# Patient Record
Sex: Female | Born: 1990 | Race: White | Hispanic: Yes | Marital: Single | Smoking: Former smoker
Health system: Southern US, Community
[De-identification: ages and names within clinical notes are randomized; demographics above are authoritative.]

## PROBLEM LIST (undated history)

## (undated) ENCOUNTER — Emergency Department (HOSPITAL_COMMUNITY): Admission: EM | Source: Home / Self Care

## (undated) DIAGNOSIS — O200 Threatened abortion: Principal | ICD-10-CM

## (undated) DIAGNOSIS — Z3490 Encounter for supervision of normal pregnancy, unspecified, unspecified trimester: Secondary | ICD-10-CM

## (undated) DIAGNOSIS — Z789 Other specified health status: Secondary | ICD-10-CM

## (undated) DIAGNOSIS — R110 Nausea: Secondary | ICD-10-CM

## (undated) HISTORY — DX: Nausea: R11.0

## (undated) HISTORY — DX: Encounter for supervision of normal pregnancy, unspecified, unspecified trimester: Z34.90

## (undated) HISTORY — DX: Threatened abortion: O20.0

## (undated) HISTORY — PX: NO PAST SURGERIES: SHX2092

---
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
## 2011-03-07 LAB — RPR: RPR Ser Ql: NONREACTIVE

## 2011-03-07 LAB — CBC
HCT: 32.1 % — ABNORMAL LOW (ref 36.0–46.0)
MCH: 23.9 pg — ABNORMAL LOW (ref 26.0–34.0)
MCHC: 31.6 g/dL (ref 30.0–36.0)
RBC: 4.24 MIL/uL (ref 3.87–5.11)
WBC: 13.9 10*3/uL — ABNORMAL HIGH (ref 4.0–10.5)

## 2011-03-12 LAB — WET PREP, GENITAL
Clue Cells Wet Prep HPF POC: NONE SEEN
Trich, Wet Prep: NONE SEEN

## 2011-03-12 LAB — DIFFERENTIAL
Eosinophils Absolute: 0 10*3/uL (ref 0.0–1.2)
Eosinophils Relative: 0 % (ref 0–5)
Lymphocytes Relative: 10 % — ABNORMAL LOW (ref 24–48)
Monocytes Relative: 2 % — ABNORMAL LOW (ref 3–11)

## 2011-03-12 LAB — URINE MICROSCOPIC-ADD ON

## 2011-03-12 LAB — URINALYSIS, ROUTINE W REFLEX MICROSCOPIC
Ketones, ur: 40 mg/dL — AB
Nitrite: POSITIVE — AB
Protein, ur: 100 mg/dL — AB
Urobilinogen, UA: 0.2 mg/dL (ref 0.0–1.0)

## 2011-03-12 LAB — URINE CULTURE: Colony Count: >=100000

## 2011-03-12 LAB — CBC
HCT: 32.5 % — ABNORMAL LOW (ref 36.0–49.0)
MCH: 30.2 pg (ref 25.0–34.0)

## 2011-03-12 LAB — GC/CHLAMYDIA PROBE AMP, GENITAL: Chlamydia, DNA Probe: NEGATIVE

## 2011-03-13 LAB — WET PREP, GENITAL: Trich, Wet Prep: NONE SEEN

## 2011-03-13 LAB — DIFFERENTIAL
Basophils Absolute: 0 10*3/uL (ref 0.0–0.1)
Eosinophils Absolute: 0.2 10*3/uL (ref 0.0–1.2)
Eosinophils Relative: 2 % (ref 0–5)
Monocytes Absolute: 0.7 10*3/uL (ref 0.2–1.2)
Neutro Abs: 5 10*3/uL (ref 1.7–8.0)
Neutrophils Relative %: 57 % (ref 43–71)

## 2011-03-13 LAB — URINALYSIS, ROUTINE W REFLEX MICROSCOPIC
Bilirubin Urine: NEGATIVE
Hgb urine dipstick: NEGATIVE
Nitrite: NEGATIVE
Protein, ur: NEGATIVE mg/dL
Specific Gravity, Urine: <1.005 — ABNORMAL LOW (ref 1.005–1.030)
Urobilinogen, UA: 0.2 mg/dL (ref 0.0–1.0)
pH: 7 (ref 5.0–8.0)

## 2011-03-13 LAB — URINE MICROSCOPIC-ADD ON

## 2011-03-13 LAB — ABO/RH: ABO/RH(D): B POS

## 2011-03-13 LAB — CBC
MCHC: 33.3 g/dL (ref 31.0–37.0)
Platelets: 140 10*3/uL — ABNORMAL LOW (ref 150–400)
WBC: 8.8 10*3/uL (ref 4.5–13.5)

## 2011-03-13 LAB — RPR: RPR Ser Ql: NONREACTIVE

## 2011-03-13 LAB — GC/CHLAMYDIA PROBE AMP, GENITAL: Chlamydia, DNA Probe: NEGATIVE

## 2012-08-05 ENCOUNTER — Emergency Department: Payer: Self-pay | Admitting: Emergency Medicine

## 2013-03-14 ENCOUNTER — Encounter: Payer: Self-pay | Admitting: Adult Health

## 2013-03-18 ENCOUNTER — Other Ambulatory Visit (INDEPENDENT_AMBULATORY_CARE_PROVIDER_SITE_OTHER): Payer: Medicaid Other

## 2013-03-18 ENCOUNTER — Other Ambulatory Visit: Payer: Self-pay | Admitting: Obstetrics & Gynecology

## 2013-03-18 ENCOUNTER — Encounter: Payer: Self-pay | Admitting: Adult Health

## 2013-03-18 DIAGNOSIS — O3680X0 Pregnancy with inconclusive fetal viability, not applicable or unspecified: Secondary | ICD-10-CM

## 2013-03-18 LAB — US OB TRANSVAGINAL

## 2013-03-27 ENCOUNTER — Encounter: Payer: Self-pay | Admitting: *Deleted

## 2013-03-28 ENCOUNTER — Ambulatory Visit (INDEPENDENT_AMBULATORY_CARE_PROVIDER_SITE_OTHER): Payer: Medicaid Other | Admitting: Adult Health

## 2013-03-28 ENCOUNTER — Encounter: Payer: Self-pay | Admitting: Adult Health

## 2013-03-28 ENCOUNTER — Other Ambulatory Visit: Payer: Self-pay | Admitting: Obstetrics & Gynecology

## 2013-03-28 ENCOUNTER — Ambulatory Visit (INDEPENDENT_AMBULATORY_CARE_PROVIDER_SITE_OTHER): Payer: Medicaid Other

## 2013-03-28 VITALS — BP 120/76 | Ht 67.0 in | Wt 110.0 lb

## 2013-03-28 DIAGNOSIS — O3680X0 Pregnancy with inconclusive fetal viability, not applicable or unspecified: Secondary | ICD-10-CM

## 2013-03-28 DIAGNOSIS — O021 Missed abortion: Secondary | ICD-10-CM

## 2013-03-28 DIAGNOSIS — O200 Threatened abortion: Secondary | ICD-10-CM

## 2013-03-28 HISTORY — DX: Threatened abortion: O20.0

## 2013-03-28 LAB — TSH: TSH: 0.862 u[IU]/mL (ref 0.350–4.500)

## 2013-03-28 LAB — HEPATITIS B SURFACE ANTIGEN: Hepatitis B Surface Ag: NEGATIVE

## 2013-03-28 LAB — HIV ANTIBODY (ROUTINE TESTING W REFLEX): HIV: NONREACTIVE

## 2013-03-28 MED ORDER — PRENATAL VITAMINS PLUS 27-1 MG PO TABS: 1.0000 | ORAL_TABLET | Freq: Every day | ORAL | Status: DC

## 2013-03-28 NOTE — Patient Instructions (Addendum)
Follow up in 2 weeks with ultra sound Call with any bleeding, sign up for my chart

## 2013-03-28 NOTE — Progress Notes (Signed)
Patricia Vaughn is a 22 yo Hispanic female,G3 P1 A1 in for ultrasound.She has 2 sacs with 1 empty and 1 with a CRL showing no growth since 03/18/13. No bleeding or discharge, or nausea or vomiting.Discussed with Dr. Despina Hidden will get follow up ultrasound in 2 weeks and see him with that visit. Will get prenatal labs today and called [facility] in to [facility] [place].Discussed with [name] and her partner about the empty sac and no fetal growth on the ultrasound and possibility of this ending in miscarriage.She is to call with any pain or bleeding and can follow up labs next week.

## 2013-03-29 LAB — CBC
Hemoglobin: 11.1 g/dL — ABNORMAL LOW (ref 12.0–15.0)
MCHC: 31.7 g/dL (ref 30.0–36.0)
Platelets: 125 10*3/uL — ABNORMAL LOW (ref 150–400)
RBC: 4.44 MIL/uL (ref 3.87–5.11)

## 2013-03-29 LAB — ANTIBODY SCREEN: Antibody Screen: NEGATIVE

## 2013-03-29 LAB — SICKLE CELL SCREEN: Sickle Cell Screen: NEGATIVE

## 2013-03-31 ENCOUNTER — Other Ambulatory Visit: Payer: Self-pay | Admitting: Obstetrics & Gynecology

## 2013-03-31 DIAGNOSIS — O021 Missed abortion: Secondary | ICD-10-CM

## 2013-03-31 LAB — US OB TRANSVAGINAL

## 2013-03-31 LAB — VARICELLA ZOSTER ANTIBODY, IGG: Varicella IgG: 1663 Index — ABNORMAL HIGH (ref <135.00)

## 2013-04-01 ENCOUNTER — Telehealth: Payer: Self-pay | Admitting: Adult Health

## 2013-04-01 NOTE — Telephone Encounter (Signed)
Called pt. With labs, number disconnected

## 2013-04-07 ENCOUNTER — Encounter: Payer: Self-pay | Admitting: *Deleted

## 2013-04-07 ENCOUNTER — Ambulatory Visit: Payer: Medicaid Other | Admitting: Women's Health

## 2013-04-15 ENCOUNTER — Encounter: Payer: Medicaid Other | Admitting: Obstetrics & Gynecology

## 2013-04-15 ENCOUNTER — Other Ambulatory Visit: Payer: Medicaid Other

## 2013-09-24 ENCOUNTER — Other Ambulatory Visit: Payer: Self-pay | Admitting: Obstetrics & Gynecology

## 2013-09-24 DIAGNOSIS — O3680X0 Pregnancy with inconclusive fetal viability, not applicable or unspecified: Secondary | ICD-10-CM

## 2013-09-30 ENCOUNTER — Ambulatory Visit (INDEPENDENT_AMBULATORY_CARE_PROVIDER_SITE_OTHER): Payer: Medicaid Other

## 2013-09-30 ENCOUNTER — Other Ambulatory Visit: Payer: Medicaid Other

## 2013-09-30 DIAGNOSIS — O3680X0 Pregnancy with inconclusive fetal viability, not applicable or unspecified: Secondary | ICD-10-CM

## 2013-09-30 DIAGNOSIS — O09299 Supervision of pregnancy with other poor reproductive or obstetric history, unspecified trimester: Secondary | ICD-10-CM

## 2013-09-30 NOTE — Progress Notes (Signed)
U/S-single IUP with +FCA noted, FHR-165 bpm, cx long and closed (4.2cm), bilateral adnexa wnl, CRL c/w 12+4wks, EDD 04/10/2014, pt to think about if she desires NT/IT screening

## 2013-10-01 ENCOUNTER — Other Ambulatory Visit: Payer: Self-pay | Admitting: Obstetrics & Gynecology

## 2013-10-01 DIAGNOSIS — O09299 Supervision of pregnancy with other poor reproductive or obstetric history, unspecified trimester: Secondary | ICD-10-CM

## 2013-10-01 DIAGNOSIS — Z36 Encounter for antenatal screening of mother: Secondary | ICD-10-CM

## 2013-10-02 ENCOUNTER — Other Ambulatory Visit: Payer: Medicaid Other

## 2013-10-02 ENCOUNTER — Encounter: Payer: Self-pay | Admitting: *Deleted

## 2013-10-08 ENCOUNTER — Encounter: Payer: Self-pay | Admitting: *Deleted

## 2013-10-08 ENCOUNTER — Encounter: Payer: Medicaid Other | Admitting: Adult Health

## 2013-11-06 ENCOUNTER — Encounter (INDEPENDENT_AMBULATORY_CARE_PROVIDER_SITE_OTHER): Payer: Self-pay

## 2013-11-06 ENCOUNTER — Ambulatory Visit (INDEPENDENT_AMBULATORY_CARE_PROVIDER_SITE_OTHER): Payer: Medicaid Other | Admitting: Adult Health

## 2013-11-06 ENCOUNTER — Encounter: Payer: Self-pay | Admitting: Adult Health

## 2013-11-06 ENCOUNTER — Other Ambulatory Visit: Payer: Self-pay | Admitting: Adult Health

## 2013-11-06 ENCOUNTER — Other Ambulatory Visit (HOSPITAL_COMMUNITY)
Admission: RE | Admit: 2013-11-06 | Discharge: 2013-11-06 | Disposition: A | Discharge status: Home / Self Care | Payer: Medicaid Other | Source: Ambulatory Visit | Attending: Adult Health | Admitting: Adult Health

## 2013-11-06 VITALS — BP 110/60 | Wt 121.0 lb

## 2013-11-06 DIAGNOSIS — Z113 Encounter for screening for infections with a predominantly sexual mode of transmission: Secondary | ICD-10-CM | POA: Insufficient documentation

## 2013-11-06 DIAGNOSIS — Z1389 Encounter for screening for other disorder: Secondary | ICD-10-CM

## 2013-11-06 DIAGNOSIS — Z3482 Encounter for supervision of other normal pregnancy, second trimester: Secondary | ICD-10-CM

## 2013-11-06 DIAGNOSIS — Z3492 Encounter for supervision of normal pregnancy, unspecified, second trimester: Secondary | ICD-10-CM

## 2013-11-06 DIAGNOSIS — Z3480 Encounter for supervision of other normal pregnancy, unspecified trimester: Secondary | ICD-10-CM | POA: Insufficient documentation

## 2013-11-06 DIAGNOSIS — Z331 Pregnant state, incidental: Secondary | ICD-10-CM

## 2013-11-06 DIAGNOSIS — R110 Nausea: Secondary | ICD-10-CM

## 2013-11-06 DIAGNOSIS — Z3490 Encounter for supervision of normal pregnancy, unspecified, unspecified trimester: Secondary | ICD-10-CM

## 2013-11-06 DIAGNOSIS — O99019 Anemia complicating pregnancy, unspecified trimester: Secondary | ICD-10-CM

## 2013-11-06 DIAGNOSIS — O09299 Supervision of pregnancy with other poor reproductive or obstetric history, unspecified trimester: Secondary | ICD-10-CM

## 2013-11-06 DIAGNOSIS — Z01419 Encounter for gynecological examination (general) (routine) without abnormal findings: Secondary | ICD-10-CM | POA: Insufficient documentation

## 2013-11-06 HISTORY — DX: Nausea: R11.0

## 2013-11-06 HISTORY — DX: Encounter for supervision of normal pregnancy, unspecified, unspecified trimester: Z34.90

## 2013-11-06 LAB — POCT URINALYSIS DIPSTICK
Blood, UA: NEGATIVE
Glucose, UA: NEGATIVE
Ketones, UA: NEGATIVE
Protein, UA: NEGATIVE

## 2013-11-06 MED ORDER — PROMETHAZINE HCL 25 MG PO TABS: 25.0000 mg | ORAL_TABLET | Freq: Four times a day (QID) | ORAL | Status: DC | PRN

## 2013-11-06 MED ORDER — PRENATAL VITAMINS PLUS 27-1 MG PO TABS: 1.0000 | ORAL_TABLET | Freq: Every day | ORAL | Status: DC

## 2013-11-06 NOTE — Patient Instructions (Signed)
Second Trimester of Pregnancy The second trimester is from week 13 through week 28, months 4 through 6. The second trimester is often a time when you feel your best. Your body has also adjusted to being pregnant, and you begin to feel better physically. Usually, morning sickness has lessened or quit completely, you may have more energy, and you may have an increase in appetite. The second trimester is also a time when the fetus is growing rapidly. At the end of the sixth month, the fetus is about 9 inches long and weighs about 1 pounds. You will likely begin to feel the baby move (quickening) between 18 and 20 weeks of the pregnancy. BODY CHANGES Your body goes through many changes during pregnancy. The changes vary from woman to woman.   Your weight will continue to increase. You will notice your lower abdomen bulging out.  You may begin to get stretch marks on your hips, abdomen, and breasts.  You may develop headaches that can be relieved by medicines approved by your caregiver.  You may urinate more often because the fetus is pressing on your bladder.  You may develop or continue to have heartburn as a result of your pregnancy.  You may develop constipation because certain hormones are causing the muscles that push waste through your intestines to slow down.  You may develop hemorrhoids or swollen, bulging veins (varicose veins).  You may have back pain because of the weight gain and pregnancy hormones relaxing your joints between the bones in your pelvis and as a result of a shift in weight and the muscles that support your balance.  Your breasts will continue to grow and be tender.  Your gums may bleed and may be sensitive to brushing and flossing.  Dark spots or blotches (chloasma, mask of pregnancy) may develop on your face. This will likely fade after the baby is born.  A dark line from your belly button to the pubic area (linea nigra) may appear. This will likely fade after the  baby is born. WHAT TO EXPECT AT YOUR PRENATAL VISITS During a routine prenatal visit:  You will be weighed to make sure you and the fetus are growing normally.  Your blood pressure will be taken.  Your abdomen will be measured to track your baby's growth.  The fetal heartbeat will be listened to.  Any test results from the previous visit will be discussed. Your caregiver may ask you:  How you are feeling.  If you are feeling the baby move.  If you have had any abnormal symptoms, such as leaking fluid, bleeding, severe headaches, or abdominal cramping.  If you have any questions. Other tests that may be performed during your second trimester include:  Blood tests that check for:  Low iron levels (anemia).  Gestational diabetes (between 24 and 28 weeks).  Rh antibodies.  Urine tests to check for infections, diabetes, or protein in the urine.  An ultrasound to confirm the proper growth and development of the baby.  An amniocentesis to check for possible genetic problems.  Fetal screens for spina bifida and Down syndrome. HOME CARE INSTRUCTIONS   Avoid all smoking, herbs, alcohol, and unprescribed drugs. These chemicals affect the formation and growth of the baby.  Follow your caregiver's instructions regarding medicine use. There are medicines that are either safe or unsafe to take during pregnancy.  Exercise only as directed by your caregiver. Experiencing uterine cramps is a good sign to stop exercising.  Continue to eat regular,   healthy meals.  Wear a good support bra for breast tenderness.  Do not use hot tubs, steam rooms, or saunas.  Wear your seat belt at all times when driving.  Avoid raw meat, uncooked cheese, cat litter boxes, and soil used by cats. These carry germs that can cause birth defects in the baby.  Take your prenatal vitamins.  Try taking a stool softener (if your caregiver approves) if you develop constipation. Eat more high-fiber foods,  such as fresh vegetables or fruit and whole grains. Drink plenty of fluids to keep your urine clear or pale yellow.  Take warm sitz baths to soothe any pain or discomfort caused by hemorrhoids. Use hemorrhoid cream if your caregiver approves.  If you develop varicose veins, wear support hose. Elevate your feet for 15 minutes, 3 4 times a day. Limit salt in your diet.  Avoid heavy lifting, wear low heel shoes, and practice good posture.  Rest with your legs elevated if you have leg cramps or low back pain.  Visit your dentist if you have not gone yet during your pregnancy. Use a soft toothbrush to brush your teeth and be gentle when you floss.  A sexual relationship may be continued unless your caregiver directs you otherwise.  Continue to go to all your prenatal visits as directed by your caregiver. SEEK MEDICAL CARE IF:   You have dizziness.  You have mild pelvic cramps, pelvic pressure, or nagging pain in the abdominal area.  You have persistent nausea, vomiting, or diarrhea.  You have a bad smelling vaginal discharge.  You have pain with urination. SEEK IMMEDIATE MEDICAL CARE IF:   You have a fever.  You are leaking fluid from your vagina.  You have spotting or bleeding from your vagina.  You have severe abdominal cramping or pain.  You have rapid weight gain or loss.  You have shortness of breath with chest pain.  You notice sudden or extreme swelling of your face, hands, ankles, feet, or legs.  You have not felt your baby move in over an hour.  You have severe headaches that do not go away with medicine.  You have vision changes. Document Released: 12/05/2001 Document Revised: 08/13/2013 Document Reviewed: 02/11/2013 [employer] Patient Information 2014 [employer]. Return in 2 weeks for US  

## 2013-11-06 NOTE — Progress Notes (Signed)
  Subjective:    Patricia Vaughn is a 22 y.o. G72P1021 Hispanic female at [redacted]w[redacted]d by [place] being seen today for her first obstetrical visit.  Her obstetrical history is significant for late presentation, quit smoking, has some nausa..  Pregnancy history fully reviewed.   Patient reports nausea, will rx phenergan.  Filed Vitals:   11/06/13 1519  BP: 110/60  Weight: 121 lb (54.885 kg)    HISTORY: OB History  Gravida Para Term Preterm AB SAB TAB Ectopic Multiple Living  4 1 1  0 2 2 0 0 0 1    # Outcome Date GA Lbr Len/2nd Weight Sex Delivery Anes PTL Lv  4 CUR           3 SAB 2014             Comments: System Generated. Please review and update pregnancy details.  2 TRM 12/02/10 [redacted]w[redacted]d  7 lb 6 oz (3.345 kg) F SVD   Y  1 SAB 2011             Past Medical History  Diagnosis Date  . Miscarriage, threatened, early pregnancy 03/28/2013  . Supervision of low-risk pregnancy 11/06/2013  . Nausea 11/06/2013   History reviewed. No pertinent past surgical history. Family History  Problem Relation Age of Onset  . Diabetes Other   . Hypertension Other   . Hyperlipidemia Father      Exam    Pelvic Exam:    Perineum: Normal Perineum   Vulva: normal   Vagina:  normal mucosa, normal discharge, no palpable nodules   Uterus   18 weks     Cervix: normal   Adnexa: Not palpable   Urinary: urethral meatus normal    System:     Skin: normal coloration and turgor, no rashes    Neurologic: oriented, normal mood   Extremities: normal strength, tone, and muscle mass   HEENT PERRLA   Mouth/Teeth mucous membranes moist   Cardiovascular: regular rate and rhythm   Respiratory:  appears well, vitals normal, no respiratory distress, acyanotic, normal RR   Abdomen: soft, non-tender    Thin prep pap smear with GC/CHL performed.   Assessment:    Pregnancy: [id number] Patient Active Problem List   Diagnosis Date Noted  . Supervision of low-risk pregnancy 11/06/2013  . Nausea 11/06/2013  .  Miscarriage, threatened, early pregnancy 03/28/2013      [redacted]w[redacted]d G4P1021 New OB visit    Plan:     Initial labs drawn Continue prenatal vitamins Problem list reviewed and updated Reviewed n/v relief measures and warning s/s to report Reviewed recommended weight gain based on pre-gravid BMI Encouraged well-balanced diet Genetic Screening discussed Quad Screen: requested Cystic fibrosis screening discussed requested Ultrasound discussed; fetal survey: requested Follow up in 2 weeks for [place] and see Dr Despina Hidden  GRIFFIN,JENNIFER 11/06/2013 3:55 PM

## 2013-11-06 NOTE — Progress Notes (Signed)
Pt here for New OB visit. Pt states that she is having some pain in her lower back. Pt given CCNC form and lab consent to read over and sign.

## 2013-11-07 LAB — URINALYSIS
Bilirubin Urine: NEGATIVE
Hgb urine dipstick: NEGATIVE
Ketones, ur: NEGATIVE mg/dL
Nitrite: NEGATIVE
Protein, ur: NEGATIVE mg/dL
Specific Gravity, Urine: 1.014 (ref 1.005–1.030)
Urobilinogen, UA: 1 mg/dL (ref 0.0–1.0)

## 2013-11-07 LAB — DRUG SCREEN, URINE, NO CONFIRMATION
Amphetamine Screen, Ur: NEGATIVE
Barbiturate Quant, Ur: NEGATIVE
Benzodiazepines.: NEGATIVE
Cocaine Metabolites: NEGATIVE
Marijuana Metabolite: NEGATIVE
Opiate Screen, Urine: NEGATIVE
Phencyclidine (PCP): NEGATIVE

## 2013-11-07 LAB — AFP, QUAD SCREEN
AFP: 42 IU/mL
Age Alone: 1:1160 {titer}
Down Syndrome Scr Risk Est: 1:7510 {titer}
HCG, Total: 11857 m[IU]/mL
Interpretation-AFP: NEGATIVE
MoM for AFP: 0.97
MoM for INH: 1.34
MoM for hCG: 0.67
Open Spina bifida: NEGATIVE
Tri 18 Scr Risk Est: NEGATIVE
uE3 Mom: 0.79
uE3 Value: 0.9 ng/mL

## 2013-11-07 LAB — CBC
MCH: 28.4 pg (ref 26.0–34.0)
MCV: 84.1 fL (ref 78.0–100.0)
Platelets: 149 10*3/uL — ABNORMAL LOW (ref 150–400)
RBC: 3.95 MIL/uL (ref 3.87–5.11)
RDW: 15.4 % (ref 11.5–15.5)
WBC: 7.9 10*3/uL (ref 4.0–10.5)

## 2013-11-07 LAB — RUBELLA SCREEN: Rubella: 1.33 Index — ABNORMAL HIGH (ref <0.90)

## 2013-11-07 LAB — VARICELLA ZOSTER ANTIBODY, IGG: Varicella IgG: 932.9 Index — ABNORMAL HIGH (ref <135.00)

## 2013-11-07 LAB — SICKLE CELL SCREEN: Sickle Cell Screen: NEGATIVE

## 2013-11-10 LAB — CYSTIC FIBROSIS DIAGNOSTIC STUDY

## 2013-11-19 ENCOUNTER — Ambulatory Visit (INDEPENDENT_AMBULATORY_CARE_PROVIDER_SITE_OTHER): Payer: Medicaid Other

## 2013-11-19 ENCOUNTER — Encounter: Payer: Self-pay | Admitting: Obstetrics & Gynecology

## 2013-11-19 ENCOUNTER — Other Ambulatory Visit: Payer: Self-pay | Admitting: Adult Health

## 2013-11-19 ENCOUNTER — Encounter (INDEPENDENT_AMBULATORY_CARE_PROVIDER_SITE_OTHER): Payer: Self-pay

## 2013-11-19 ENCOUNTER — Ambulatory Visit (INDEPENDENT_AMBULATORY_CARE_PROVIDER_SITE_OTHER): Payer: Medicaid Other | Admitting: Obstetrics & Gynecology

## 2013-11-19 VITALS — BP 100/60 | Wt 121.0 lb

## 2013-11-19 DIAGNOSIS — O09299 Supervision of pregnancy with other poor reproductive or obstetric history, unspecified trimester: Secondary | ICD-10-CM

## 2013-11-19 DIAGNOSIS — Z1389 Encounter for screening for other disorder: Secondary | ICD-10-CM

## 2013-11-19 DIAGNOSIS — F1920 Other psychoactive substance dependence, uncomplicated: Secondary | ICD-10-CM

## 2013-11-19 DIAGNOSIS — O99019 Anemia complicating pregnancy, unspecified trimester: Secondary | ICD-10-CM

## 2013-11-19 DIAGNOSIS — Z3482 Encounter for supervision of other normal pregnancy, second trimester: Secondary | ICD-10-CM

## 2013-11-19 DIAGNOSIS — Z331 Pregnant state, incidental: Secondary | ICD-10-CM

## 2013-11-19 LAB — POCT URINALYSIS DIPSTICK
Blood, UA: NEGATIVE
Ketones, UA: NEGATIVE
Leukocytes, UA: NEGATIVE

## 2013-11-19 NOTE — Progress Notes (Signed)
BP weight and urine results all reviewed and noted. Patient reports good fetal movement, denies any bleeding and no rupture of membranes symptoms or regular contractions. Patient is without complaints. All questions were answered.  

## 2013-11-19 NOTE — Progress Notes (Signed)
U/S(19+5wks)-active fetus, meas c/w dates, fluid wnl, anterior gr 0 placenta, cx long and closed 3.3cm, bilateral adnexa wnl, no major abnl noted, female fetus, FHR 142 bpm,

## 2013-11-19 NOTE — Addendum Note (Signed)
Addended by: Richardson Chiquito on: 11/19/2013 12:27 PM   Modules accepted: Orders

## 2013-12-16 ENCOUNTER — Ambulatory Visit (INDEPENDENT_AMBULATORY_CARE_PROVIDER_SITE_OTHER): Payer: Medicaid Other | Admitting: Advanced Practice Midwife

## 2013-12-16 ENCOUNTER — Encounter: Payer: Self-pay | Admitting: Advanced Practice Midwife

## 2013-12-16 VITALS — BP 94/54 | Wt 128.5 lb

## 2013-12-16 DIAGNOSIS — O09299 Supervision of pregnancy with other poor reproductive or obstetric history, unspecified trimester: Secondary | ICD-10-CM

## 2013-12-16 DIAGNOSIS — Z3492 Encounter for supervision of normal pregnancy, unspecified, second trimester: Secondary | ICD-10-CM

## 2013-12-16 DIAGNOSIS — O99019 Anemia complicating pregnancy, unspecified trimester: Secondary | ICD-10-CM

## 2013-12-16 DIAGNOSIS — Z23 Encounter for immunization: Secondary | ICD-10-CM

## 2013-12-16 DIAGNOSIS — Z1389 Encounter for screening for other disorder: Secondary | ICD-10-CM

## 2013-12-16 DIAGNOSIS — Z331 Pregnant state, incidental: Secondary | ICD-10-CM

## 2013-12-16 LAB — POCT URINALYSIS DIPSTICK
Glucose, UA: NEGATIVE
Ketones, UA: NEGATIVE
Leukocytes, UA: NEGATIVE
Nitrite, UA: NEGATIVE

## 2013-12-16 NOTE — Progress Notes (Signed)
No c/o at this time except round igament pain., Handout given.  Maternity belt recommeded  Routine questions about pregnancy answered.  F/U in 4 weeks for PN2/LROB.Marland Kitchen

## 2013-12-16 NOTE — Patient Instructions (Addendum)
1. Before your test, do not eat or drink anything for 8-10 hours prior to your  appointment (a small amount of water is allowed and you may take any medicines you normally take). 2. When you arrive, your blood will be drawn for a 'fasting' blood sugar level.  Then you will be given a sweetened carbonated beverage to drink. You should  complete drinking this beverage within five minutes. After finishing the  beverage, you will have your blood drawn exactly 1 and 2 hours later. Having  your blood drawn on time is an important part of this test. A total of three blood  samples will be done. 3. The test takes approximately 2  hours. During the test, do not have anything to  eat or drink. Do not smoke, chew gum (not even sugarless gum) or use breath mints.  4. During the test you should remain close by and seated as much as possible and  avoid walking around. You may want to bring a book or something else to  occupy your time.  5. After your test, you may eat and drink as normal. You may want to bring a snack  to eat after the test is finished. Your provider will advise you as to the results of  this test and any follow-up if necessary Abdominal Pain During Pregnancy Abdominal discomfort is common in pregnancy. Most of the time, it does not cause harm. There are many causes of abdominal pain. Some causes are more serious than others. Some of the causes of abdominal pain in pregnancy are easily diagnosed. Occasionally, the diagnosis takes time to understand. Other times, the cause is not determined. Abdominal pain can be a sign that something is very wrong with the pregnancy, or the pain may have nothing to do with the pregnancy at all. For this reason, always tell your caregiver if you have any abdominal discomfort. CAUSES Common and harmless causes of abdominal pain include:  Constipation.  Excess gas and bloating.  Round ligament pain. This is pain that is felt in the folds of the  groin.  The position the baby or placenta is in.  Baby kicks.  Braxton-Hicks contractions. These are mild contractions that do not cause cervical dilation. Serious causes of abdominal pain include:  Ectopic pregnancy. This happens when a fertilized egg implants outside of the uterus.  Miscarriage.  Preterm labor. This is when labor starts at less than 37 weeks of pregnancy.  Placental abruption. This is when the placenta partially or completely separates from the uterus.  Preeclampsia. This is often associated with high blood pressure and has been referred to as "toxemia in pregnancy."  Uterine or amniotic fluid infections. Causes unrelated to pregnancy include:  Urinary tract infection.  Gallbladder stones or inflammation.  Hepatitis or other liver illness.  Intestinal problems, stomach flu, food poisoning, or ulcer.  Appendicitis.  Kidney (renal) stones.  Kidney infection (pylonephritis). HOME CARE INSTRUCTIONS  For mild pain:  Do not have sexual intercourse or put anything in your vagina until your symptoms go away completely.  Get plenty of rest until your pain improves. If your pain does not improve in 1 hour, call your caregiver.  Drink clear fluids if you feel nauseous. Avoid solid food as long as you are uncomfortable or nauseous.  Only take medicine as directed by your caregiver.  Keep all follow-up appointments with your caregiver. SEEK IMMEDIATE MEDICAL CARE IF:  You are bleeding, leaking fluid, or passing tissue from the vagina.  You  have increasing pain or cramping.  You have persistent vomiting.  You have painful or bloody urination.  You have a fever.  You notice a decrease in your baby's movements.  You have extreme weakness or feel faint.  You have shortness of breath, with or without abdominal pain.  You develop a severe headache with abdominal pain.  You have abnormal vaginal discharge with abdominal pain.  You have persistent  diarrhea.  You have abdominal pain that continues even after rest, or gets worse. MAKE SURE YOU:   Understand these instructions.  Will watch your condition.  Will get help right away if you are not doing well or get worse. Document Released: 12/11/2005 Document Revised: 03/04/2012 Document Reviewed: 07/10/2013 [facility] Patient Information 2014 [place], [place].

## 2013-12-25 NOTE — L&D Delivery Note (Signed)
Attestation of Attending Supervision of Advanced Practitioner (CNM/NP): Evaluation and management procedures were performed by the Advanced Practitioner under my supervision and collaboration. I have reviewed the Advanced Practitioner's note and chart, and I agree with the management and plan.  Lesly DukesKelly H Ho Parisi 5:45 PM

## 2013-12-25 NOTE — L&D Delivery Note (Signed)
Delivery Note At 6:53 PM a viable female was delivered via Vaginal, Spontaneous Delivery (Presentation: Right Occiput Anterior).  APGAR: 9, 9; weight:pending .   Placenta status: Intact, Spontaneous.  Cord: 3 vessels with the following complications: None.  Infant birthed without complications and was placed directly on mom's abdomen for bonding/skin-to-skin. Cord allowed to stop pulsing, and was clamped x 2, and cut by fob.   Anesthesia: None  Episiotomy: None Lacerations: none Suture Repair: n/a Est. Blood Loss (mL): 300  Placenta to: BS Feeding: breast/bottle Circ: n/a Contraception: depo   Mom to postpartum.  Baby to Couplet care / Skin to Skin.  Cheron EveryKimberly Randall [facility] 04/11/2014, 7:12 PM

## 2014-01-14 ENCOUNTER — Other Ambulatory Visit: Payer: Medicaid Other

## 2014-01-14 ENCOUNTER — Ambulatory Visit (INDEPENDENT_AMBULATORY_CARE_PROVIDER_SITE_OTHER): Payer: Medicaid Other | Admitting: Women's Health

## 2014-01-14 ENCOUNTER — Encounter: Payer: Self-pay | Admitting: Women's Health

## 2014-01-14 VITALS — BP 102/58 | Wt 131.5 lb

## 2014-01-14 DIAGNOSIS — Z1389 Encounter for screening for other disorder: Secondary | ICD-10-CM

## 2014-01-14 DIAGNOSIS — Z3480 Encounter for supervision of other normal pregnancy, unspecified trimester: Secondary | ICD-10-CM

## 2014-01-14 DIAGNOSIS — O09299 Supervision of pregnancy with other poor reproductive or obstetric history, unspecified trimester: Secondary | ICD-10-CM

## 2014-01-14 DIAGNOSIS — Z3490 Encounter for supervision of normal pregnancy, unspecified, unspecified trimester: Secondary | ICD-10-CM

## 2014-01-14 DIAGNOSIS — Z331 Pregnant state, incidental: Secondary | ICD-10-CM

## 2014-01-14 DIAGNOSIS — O99019 Anemia complicating pregnancy, unspecified trimester: Secondary | ICD-10-CM

## 2014-01-14 LAB — CBC
HEMATOCRIT: 34 % — AB (ref 36.0–46.0)
HEMOGLOBIN: 11.1 g/dL — AB (ref 12.0–15.0)
MCH: 27.3 pg (ref 26.0–34.0)
MCHC: 32.6 g/dL (ref 30.0–36.0)
MCV: 83.7 fL (ref 78.0–100.0)
Platelets: 148 10*3/uL — ABNORMAL LOW (ref 150–400)
RBC: 4.06 MIL/uL (ref 3.87–5.11)
RDW: 13.8 % (ref 11.5–15.5)
WBC: 9.1 10*3/uL (ref 4.0–10.5)

## 2014-01-14 LAB — POCT URINALYSIS DIPSTICK
Blood, UA: NEGATIVE
Glucose, UA: NEGATIVE
Ketones, UA: NEGATIVE
Leukocytes, UA: NEGATIVE
NITRITE UA: NEGATIVE
PROTEIN UA: NEGATIVE

## 2014-01-14 NOTE — Patient Instructions (Signed)
Third Trimester of Pregnancy The third trimester is from week 29 through week 42, months 7 through 9. The third trimester is a time when the fetus is growing rapidly. At the end of the ninth month, the fetus is about 20 inches in length and weighs 6 10 pounds.  BODY CHANGES Your body goes through many changes during pregnancy. The changes vary from woman to woman.   Your weight will continue to increase. You can expect to gain 25 35 pounds (11 16 kg) by the end of the pregnancy.  You may begin to get stretch marks on your hips, abdomen, and breasts.  You may urinate more often because the fetus is moving lower into your pelvis and pressing on your bladder.  You may develop or continue to have heartburn as a result of your pregnancy.  You may develop constipation because certain hormones are causing the muscles that push waste through your intestines to slow down.  You may develop hemorrhoids or swollen, bulging veins (varicose veins).  You may have pelvic pain because of the weight gain and pregnancy hormones relaxing your joints between the bones in your pelvis. Back aches may result from over exertion of the muscles supporting your posture.  Your breasts will continue to grow and be tender. A yellow discharge may leak from your breasts called colostrum.  Your belly button may stick out.  You may feel short of breath because of your expanding uterus.  You may notice the fetus "dropping," or moving lower in your abdomen.  You may have a bloody mucus discharge. This usually occurs a few days to a week before labor begins.  Your cervix becomes thin and soft (effaced) near your due date. WHAT TO EXPECT AT YOUR PRENATAL EXAMS  You will have prenatal exams every 2 weeks until week 36. Then, you will have weekly prenatal exams. During a routine prenatal visit:  You will be weighed to make sure you and the fetus are growing normally.  Your blood pressure is taken.  Your abdomen will be  measured to track your baby's growth.  The fetal heartbeat will be listened to.  Any test results from the previous visit will be discussed.  You may have a cervical check near your due date to see if you have effaced. At around 36 weeks, your caregiver will check your cervix. At the same time, your caregiver will also perform a test on the secretions of the vaginal tissue. This test is to determine if a type of bacteria, Group B streptococcus, is present. Your caregiver will explain this further. Your caregiver may ask you:  What your birth plan is.  How you are feeling.  If you are feeling the baby move.  If you have had any abnormal symptoms, such as leaking fluid, bleeding, severe headaches, or abdominal cramping.  If you have any questions. Other tests or screenings that may be performed during your third trimester include:  Blood tests that check for low iron levels (anemia).  Fetal testing to check the health, activity level, and growth of the fetus. Testing is done if you have certain medical conditions or if there are problems during the pregnancy. FALSE LABOR You may feel small, irregular contractions that eventually go away. These are called Braxton Hicks contractions, or false labor. Contractions may last for hours, days, or even weeks before true labor sets in. If contractions come at regular intervals, intensify, or become painful, it is best to be seen by your caregiver.  SIGNS OF LABOR   Menstrual-like cramps.  Contractions that are 5 minutes apart or less.  Contractions that start on the top of the uterus and spread down to the lower abdomen and back.  A sense of increased pelvic pressure or back pain.  A watery or bloody mucus discharge that comes from the vagina. If you have any of these signs before the 37th week of pregnancy, call your caregiver right away. You need to go to the hospital to get checked immediately. HOME CARE INSTRUCTIONS   Avoid all  smoking, herbs, alcohol, and unprescribed drugs. These chemicals affect the formation and growth of the baby.  Follow your caregiver's instructions regarding medicine use. There are medicines that are either safe or unsafe to take during pregnancy.  Exercise only as directed by your caregiver. Experiencing uterine cramps is a good sign to stop exercising.  Continue to eat regular, healthy meals.  Wear a good support bra for breast tenderness.  Do not use hot tubs, steam rooms, or saunas.  Wear your seat belt at all times when driving.  Avoid raw meat, uncooked cheese, cat litter boxes, and soil used by cats. These carry germs that can cause birth defects in the baby.  Take your prenatal vitamins.  Try taking a stool softener (if your caregiver approves) if you develop constipation. Eat more high-fiber foods, such as fresh vegetables or fruit and whole grains. Drink plenty of fluids to keep your urine clear or pale yellow.  Take warm sitz baths to soothe any pain or discomfort caused by hemorrhoids. Use hemorrhoid cream if your caregiver approves.  If you develop varicose veins, wear support hose. Elevate your feet for 15 minutes, 3 4 times a day. Limit salt in your diet.  Avoid heavy lifting, wear low heal shoes, and practice good posture.  Rest a lot with your legs elevated if you have leg cramps or low back pain.  Visit your dentist if you have not gone during your pregnancy. Use a soft toothbrush to brush your teeth and be gentle when you floss.  A sexual relationship may be continued unless your caregiver directs you otherwise.  Do not travel far distances unless it is absolutely necessary and only with the approval of your caregiver.  Take prenatal classes to understand, practice, and ask questions about the labor and delivery.  Make a trial run to the hospital.  Pack your hospital bag.  Prepare the baby's nursery.  Continue to go to all your prenatal visits as directed  by your caregiver. SEEK MEDICAL CARE IF:  You are unsure if you are in labor or if your water has broken.  You have dizziness.  You have mild pelvic cramps, pelvic pressure, or nagging pain in your abdominal area.  You have persistent nausea, vomiting, or diarrhea.  You have a bad smelling vaginal discharge.  You have pain with urination. SEEK IMMEDIATE MEDICAL CARE IF:   You have a fever.  You are leaking fluid from your vagina.  You have spotting or bleeding from your vagina.  You have severe abdominal cramping or pain.  You have rapid weight loss or gain.  You have shortness of breath with chest pain.  You notice sudden or extreme swelling of your face, hands, ankles, feet, or legs.  You have not felt your baby move in over an hour.  You have severe headaches that do not go away with medicine.  You have vision changes. Document Released: 12/05/2001 Document Revised: 08/13/2013 Document Reviewed:   02/11/2013 ExitCare Patient Information 2014 [place] [place].  Etonogestrel implant- Nexplanon What is this medicine? ETONOGESTREL (et oh noe JES trel) is a contraceptive (birth control) device. It is used to prevent pregnancy. It can be used for up to 3 years. This medicine may be used for other purposes; ask your health care provider or pharmacist if you have questions. COMMON BRAND NAME(S): Implanon, Nexplanon  What should I tell my health care provider before I take this medicine? They need to know if you have any of these conditions: -abnormal vaginal bleeding -blood vessel disease or blood clots -cancer of the breast, cervix, or liver -depression -diabetes -gallbladder disease -headaches -heart disease or recent heart attack -high blood pressure -high cholesterol -kidney disease -liver disease -renal disease -seizures -tobacco smoker -an unusual or allergic reaction to etonogestrel, other hormones, anesthetics or antiseptics, medicines, foods, dyes, or  preservatives -pregnant or trying to get pregnant -breast-feeding How should I use this medicine? This device is inserted just under the skin on the inner side of your upper arm by a health care professional. Talk to your pediatrician regarding the use of this medicine in children. Special care may be needed. Overdosage: If you think you've taken too much of this medicine contact a poison control center or emergency room at once. Overdosage: If you think you have taken too much of this medicine contact a poison control center or emergency room at once. NOTE: This medicine is only for you. Do not share this medicine with others. What if I miss a dose? This does not apply. What may interact with this medicine? Do not take this medicine with any of the following medications: -amprenavir -bosentan -fosamprenavir This medicine may also interact with the following medications: -barbiturate medicines for inducing sleep or treating seizures -certain medicines for fungal infections like ketoconazole and itraconazole -griseofulvin -medicines to treat seizures like carbamazepine, felbamate, oxcarbazepine, phenytoin, topiramate -modafinil -phenylbutazone -rifampin -some medicines to treat HIV infection like atazanavir, indinavir, lopinavir, nelfinavir, tipranavir, ritonavir -St. John's wort This list may not describe all possible interactions. Give your health care provider a list of all the medicines, herbs, non-prescription drugs, or dietary supplements you use. Also tell them if you smoke, drink alcohol, or use illegal drugs. Some items may interact with your medicine. What should I watch for while using this medicine? This product does not protect you against HIV infection (AIDS) or other sexually transmitted diseases. You should be able to feel the implant by pressing your fingertips over the skin where it was inserted. Tell your doctor if you cannot feel the implant. What side effects may I  notice from receiving this medicine? Side effects that you should report to your doctor or health care professional as soon as possible: -allergic reactions like skin rash, itching or hives, swelling of the face, lips, or tongue -breast lumps -changes in vision -confusion, trouble speaking or understanding -dark urine -depressed mood -general ill feeling or flu-like symptoms -light-colored stools -loss of appetite, nausea -right upper belly pain -severe headaches -severe pain, swelling, or tenderness in the abdomen -shortness of breath, chest pain, swelling in a leg -signs of pregnancy -sudden numbness or weakness of the face, arm or leg -trouble walking, dizziness, loss of balance or coordination -unusual vaginal bleeding, discharge -unusually weak or tired -yellowing of the eyes or skin Side effects that usually do not require medical attention (Report these to your doctor or health care professional if they continue or are bothersome.): -acne -breast pain -changes in weight -cough -  fever or chills -headache -irregular menstrual bleeding -itching, burning, and vaginal discharge -pain or difficulty passing urine -sore throat This list may not describe all possible side effects. Call your doctor for medical advice about side effects. You may report side effects to FDA at 1-800-FDA-1088. Where should I keep my medicine? This drug is given in a hospital or clinic and will not be stored at home. NOTE: This sheet is a summary. It may not cover all possible information. If you have questions about this medicine, talk to your doctor, pharmacist, or health care provider.  2014, [employer]. (2012-06-17 15:37:45)

## 2014-01-14 NOTE — Progress Notes (Signed)
Reports good fm. Denies uc's, lof, vb, uti s/s.  No complaints.  Reviewed ptl s/s, fkc.  All questions answered. F/U in 4wks for visit.  PN2 today.

## 2014-01-15 ENCOUNTER — Encounter: Payer: Self-pay | Admitting: Women's Health

## 2014-01-15 LAB — ANTIBODY SCREEN: ANTIBODY SCREEN: NEGATIVE

## 2014-01-15 LAB — HSV 2 ANTIBODY, IGG: HSV 2 GLYCOPROTEIN G AB, IGG: 0.11 IV

## 2014-01-15 LAB — GLUCOSE TOLERANCE, 2 HOURS W/ 1HR
GLUCOSE, 2 HOUR: 96 mg/dL (ref 70–139)
Glucose, 1 hour: 123 mg/dL (ref 70–170)
Glucose, Fasting: 74 mg/dL (ref 70–99)

## 2014-01-15 LAB — RPR

## 2014-01-15 LAB — HIV ANTIBODY (ROUTINE TESTING W REFLEX): HIV: NONREACTIVE

## 2014-02-11 ENCOUNTER — Encounter: Payer: Self-pay | Admitting: Advanced Practice Midwife

## 2014-02-11 ENCOUNTER — Ambulatory Visit (INDEPENDENT_AMBULATORY_CARE_PROVIDER_SITE_OTHER): Payer: Medicaid Other | Admitting: Advanced Practice Midwife

## 2014-02-11 VITALS — BP 98/54 | Wt 135.0 lb

## 2014-02-11 DIAGNOSIS — O09299 Supervision of pregnancy with other poor reproductive or obstetric history, unspecified trimester: Secondary | ICD-10-CM

## 2014-02-11 DIAGNOSIS — Z331 Pregnant state, incidental: Secondary | ICD-10-CM

## 2014-02-11 DIAGNOSIS — Z1389 Encounter for screening for other disorder: Secondary | ICD-10-CM

## 2014-02-11 DIAGNOSIS — O99019 Anemia complicating pregnancy, unspecified trimester: Secondary | ICD-10-CM

## 2014-02-11 LAB — POCT URINALYSIS DIPSTICK
Blood, UA: NEGATIVE
Glucose, UA: NEGATIVE
Ketones, UA: NEGATIVE
Leukocytes, UA: NEGATIVE
Nitrite, UA: NEGATIVE

## 2014-02-11 NOTE — Progress Notes (Signed)
C/O increased mucous discharge.  SSE: normal appearing discharge.  Cx firm/posterior/closed/long.  Routine questions about pregnancy answered.  F/U in 2 weeks for Low-risk ob appt .

## 2014-02-25 ENCOUNTER — Ambulatory Visit (INDEPENDENT_AMBULATORY_CARE_PROVIDER_SITE_OTHER): Payer: Medicaid Other | Admitting: Women's Health

## 2014-02-25 ENCOUNTER — Encounter: Payer: Self-pay | Admitting: Women's Health

## 2014-02-25 VITALS — BP 98/58 | Wt 135.5 lb

## 2014-02-25 DIAGNOSIS — O26899 Other specified pregnancy related conditions, unspecified trimester: Secondary | ICD-10-CM

## 2014-02-25 DIAGNOSIS — Z1389 Encounter for screening for other disorder: Secondary | ICD-10-CM

## 2014-02-25 DIAGNOSIS — O09299 Supervision of pregnancy with other poor reproductive or obstetric history, unspecified trimester: Secondary | ICD-10-CM

## 2014-02-25 DIAGNOSIS — R109 Unspecified abdominal pain: Secondary | ICD-10-CM

## 2014-02-25 DIAGNOSIS — Z3480 Encounter for supervision of other normal pregnancy, unspecified trimester: Secondary | ICD-10-CM

## 2014-02-25 DIAGNOSIS — O9989 Other specified diseases and conditions complicating pregnancy, childbirth and the puerperium: Secondary | ICD-10-CM

## 2014-02-25 DIAGNOSIS — O2390 Unspecified genitourinary tract infection in pregnancy, unspecified trimester: Secondary | ICD-10-CM

## 2014-02-25 DIAGNOSIS — O99019 Anemia complicating pregnancy, unspecified trimester: Secondary | ICD-10-CM

## 2014-02-25 DIAGNOSIS — Z331 Pregnant state, incidental: Secondary | ICD-10-CM

## 2014-02-25 LAB — POCT URINALYSIS DIPSTICK
Glucose, UA: NEGATIVE
Ketones, UA: NEGATIVE
LEUKOCYTES UA: NEGATIVE
Nitrite, UA: NEGATIVE
RBC UA: NEGATIVE

## 2014-02-25 LAB — OB RESULTS CONSOLE GC/CHLAMYDIA
CHLAMYDIA, DNA PROBE: NEGATIVE
GC PROBE AMP, GENITAL: NEGATIVE

## 2014-02-25 LAB — POCT WET PREP (WET MOUNT): CLUE CELLS WET PREP WHIFF POC: NEGATIVE

## 2014-02-25 NOTE — Patient Instructions (Addendum)
Third Trimester of Pregnancy The third trimester is from week 29 through week 42, months 7 through 9. The third trimester is a time when the fetus is growing rapidly. At the end of the ninth month, the fetus is about 20 inches in length and weighs 6 10 pounds.  BODY CHANGES Your body goes through many changes during pregnancy. The changes vary from woman to woman.   Your weight will continue to increase. You can expect to gain 25 35 pounds (11 16 kg) by the end of the pregnancy.  You may begin to get stretch marks on your hips, abdomen, and breasts.  You may urinate more often because the fetus is moving lower into your pelvis and pressing on your bladder.  You may develop or continue to have heartburn as a result of your pregnancy.  You may develop constipation because certain hormones are causing the muscles that push waste through your intestines to slow down.  You may develop hemorrhoids or swollen, bulging veins (varicose veins).  You may have pelvic pain because of the weight gain and pregnancy hormones relaxing your joints between the bones in your pelvis. Back aches may result from over exertion of the muscles supporting your posture.  Your breasts will continue to grow and be tender. A yellow discharge may leak from your breasts called colostrum.  Your belly button may stick out.  You may feel short of breath because of your expanding uterus.  You may notice the fetus "dropping," or moving lower in your abdomen.  You may have a bloody mucus discharge. This usually occurs a few days to a week before labor begins.  Your cervix becomes thin and soft (effaced) near your due date. WHAT TO EXPECT AT YOUR PRENATAL EXAMS  You will have prenatal exams every 2 weeks until week 36. Then, you will have weekly prenatal exams. During a routine prenatal visit:  You will be weighed to make sure you and the fetus are growing normally.  Your blood pressure is taken.  Your abdomen will be  measured to track your baby's growth.  The fetal heartbeat will be listened to.  Any test results from the previous visit will be discussed.  You may have a cervical check near your due date to see if you have effaced. At around 36 weeks, your caregiver will check your cervix. At the same time, your caregiver will also perform a test on the secretions of the vaginal tissue. This test is to determine if a type of bacteria, Group B streptococcus, is present. Your caregiver will explain this further. Your caregiver may ask you:  What your birth plan is.  How you are feeling.  If you are feeling the baby move.  If you have had any abnormal symptoms, such as leaking fluid, bleeding, severe headaches, or abdominal cramping.  If you have any questions. Other tests or screenings that may be performed during your third trimester include:  Blood tests that check for low iron levels (anemia).  Fetal testing to check the health, activity level, and growth of the fetus. Testing is done if you have certain medical conditions or if there are problems during the pregnancy. FALSE LABOR You may feel small, irregular contractions that eventually go away. These are called Braxton Hicks contractions, or false labor. Contractions may last for hours, days, or even weeks before true labor sets in. If contractions come at regular intervals, intensify, or become painful, it is best to be seen by your caregiver.    SIGNS OF LABOR   Menstrual-like cramps.  Contractions that are 5 minutes apart or less.  Contractions that start on the top of the uterus and spread down to the lower abdomen and back.  A sense of increased pelvic pressure or back pain.  A watery or bloody mucus discharge that comes from the vagina. If you have any of these signs before the 37th week of pregnancy, call your caregiver right away. You need to go to the hospital to get checked immediately. HOME CARE INSTRUCTIONS   Avoid all  smoking, herbs, alcohol, and unprescribed drugs. These chemicals affect the formation and growth of the baby.  Follow your caregiver's instructions regarding medicine use. There are medicines that are either safe or unsafe to take during pregnancy.  Exercise only as directed by your caregiver. Experiencing uterine cramps is a good sign to stop exercising.  Continue to eat regular, healthy meals.  Wear a good support bra for breast tenderness.  Do not use hot tubs, steam rooms, or saunas.  Wear your seat belt at all times when driving.  Avoid raw meat, uncooked cheese, cat litter boxes, and soil used by cats. These carry germs that can cause birth defects in the baby.  Take your prenatal vitamins.  Try taking a stool softener (if your caregiver approves) if you develop constipation. Eat more high-fiber foods, such as fresh vegetables or fruit and whole grains. Drink plenty of fluids to keep your urine clear or pale yellow.  Take warm sitz baths to soothe any pain or discomfort caused by hemorrhoids. Use hemorrhoid cream if your caregiver approves.  If you develop varicose veins, wear support hose. Elevate your feet for 15 minutes, 3 4 times a day. Limit salt in your diet.  Avoid heavy lifting, wear low heal shoes, and practice good posture.  Rest a lot with your legs elevated if you have leg cramps or low back pain.  Visit your dentist if you have not gone during your pregnancy. Use a soft toothbrush to brush your teeth and be gentle when you floss.  A sexual relationship may be continued unless your caregiver directs you otherwise.  Do not travel far distances unless it is absolutely necessary and only with the approval of your caregiver.  Take prenatal classes to understand, practice, and ask questions about the labor and delivery.  Make a trial run to the hospital.  Pack your hospital bag.  Prepare the baby's nursery.  Continue to go to all your prenatal visits as directed  by your caregiver. SEEK MEDICAL CARE IF:  You are unsure if you are in labor or if your water has broken.  You have dizziness.  You have mild pelvic cramps, pelvic pressure, or nagging pain in your abdominal area.  You have persistent nausea, vomiting, or diarrhea.  You have a bad smelling vaginal discharge.  You have pain with urination. SEEK IMMEDIATE MEDICAL CARE IF:   You have a fever.  You are leaking fluid from your vagina.  You have spotting or bleeding from your vagina.  You have severe abdominal cramping or pain.  You have rapid weight loss or gain.  You have shortness of breath with chest pain.  You notice sudden or extreme swelling of your face, hands, ankles, feet, or legs.  You have not felt your baby move in over an hour.  You have severe headaches that do not go away with medicine.  You have vision changes. Document Released: 12/05/2001 Document Revised: 08/13/2013 Document Reviewed:   02/11/2013 ExitCare Patient Information 2014 [employer].  Preterm Labor Information Preterm labor is when labor starts at less than 37 weeks of pregnancy. The normal length of a pregnancy is 39 to 41 weeks. CAUSES Often, there is no identifiable underlying cause as to why a woman goes into preterm labor. One of the most common known causes of preterm labor is infection. Infections of the uterus, cervix, vagina, amniotic sac, bladder, kidney, or even the lungs (pneumonia) can cause labor to start. Other suspected causes of preterm labor include:   Urogenital infections, such as yeast infections and bacterial vaginosis.   Uterine abnormalities (uterine shape, uterine septum, fibroids, or bleeding from the placenta).   A cervix that has been operated on (it may fail to stay closed).   Malformations in the fetus.   Multiple gestations (twins, triplets, and so on).   Breakage of the amniotic sac.  RISK FACTORS  Having a previous history of preterm labor.    Having premature rupture of membranes (PROM).   Having a placenta that covers the opening of the cervix (placenta previa).   Having a placenta that separates from the uterus (placental abruption).   Having a cervix that is too weak to hold the fetus in the uterus (incompetent cervix).   Having too much fluid in the amniotic sac (polyhydramnios).   Taking illegal drugs or smoking while pregnant.   Not gaining enough weight while pregnant.   Being younger than 18 and older than 22 years old.   Having a low socioeconomic status.   Being African American. SYMPTOMS Signs and symptoms of preterm labor include:   Menstrual-like cramps, abdominal pain, or back pain.  Uterine contractions that are regular, as frequent as six in an hour, regardless of their intensity (may be mild or painful).  Contractions that start on the top of the uterus and spread down to the lower abdomen and back.   A sense of increased pelvic pressure.   A watery or bloody mucus discharge that comes from the vagina.  TREATMENT Depending on the length of the pregnancy and other circumstances, your health care provider may suggest bed rest. If necessary, there are medicines that can be given to stop contractions and to mature the fetal lungs. If labor happens before 34 weeks of pregnancy, a prolonged hospital stay may be recommended. Treatment depends on the condition of both you and the fetus.  WHAT SHOULD YOU DO IF YOU THINK YOU ARE IN PRETERM LABOR? Call your health care provider right away. You will need to go to the hospital to get checked immediately. HOW CAN YOU PREVENT PRETERM LABOR IN FUTURE PREGNANCIES? You should:   Stop smoking if you smoke.  Maintain healthy weight gain and avoid chemicals and drugs that are not necessary.  Be watchful for any type of infection.  Inform your health care provider if you have a known history of preterm labor. Document Released: 03/02/2004 Document  Revised: 08/13/2013 Document Reviewed: 01/13/2013 ExitCare Patient Information 2014 [employer].  

## 2014-02-25 NOTE — Progress Notes (Signed)
Not able to void yet. Reports good fm. Denies uc's, lof, vb, uti s/s, abnormal/malodorous d/c or itching/irritation.  Cramp/pressure that lasts ~1730mins 2-3x/d.  Spec exam: cx visually closed, mod amount thin yellowish nonodorous d/c. Wet prep: mod wbc's, otherwise neg. SVE: outer os 1/inner closed/thick/soft, posterior, vtx. Recommended increasing po fluids, emptying bladder frequently. Reviewed ptl s/s, fkc.  All questions answered. F/U in 2wks for visit.  Gave glass of water, will send urine for gc/ch when she's able to void.

## 2014-02-25 NOTE — Addendum Note (Signed)
Addended by: Colen DarlingYOUNG, Danylah Holden S on: 02/25/2014 12:25 PM   Modules accepted: Orders

## 2014-02-26 LAB — GC/CHLAMYDIA PROBE AMP
CT Probe RNA: NEGATIVE
GC Probe RNA: NEGATIVE

## 2014-03-11 ENCOUNTER — Encounter: Payer: Self-pay | Admitting: Obstetrics & Gynecology

## 2014-03-11 ENCOUNTER — Ambulatory Visit (INDEPENDENT_AMBULATORY_CARE_PROVIDER_SITE_OTHER): Payer: Medicaid Other | Admitting: Obstetrics & Gynecology

## 2014-03-11 VITALS — BP 90/60 | Wt 142.0 lb

## 2014-03-11 DIAGNOSIS — Z1389 Encounter for screening for other disorder: Secondary | ICD-10-CM

## 2014-03-11 DIAGNOSIS — O09299 Supervision of pregnancy with other poor reproductive or obstetric history, unspecified trimester: Secondary | ICD-10-CM

## 2014-03-11 DIAGNOSIS — Z331 Pregnant state, incidental: Secondary | ICD-10-CM

## 2014-03-11 DIAGNOSIS — O99019 Anemia complicating pregnancy, unspecified trimester: Secondary | ICD-10-CM

## 2014-03-11 LAB — POCT URINALYSIS DIPSTICK
Blood, UA: NEGATIVE
Glucose, UA: NEGATIVE
Ketones, UA: NEGATIVE
LEUKOCYTES UA: NEGATIVE
NITRITE UA: NEGATIVE

## 2014-03-11 NOTE — Progress Notes (Signed)
BP weight and urine results all reviewed and noted. Patient reports good fetal movement, denies any bleeding and no rupture of membranes symptoms or regular contractions. Patient is without complaints. All questions were answered.  

## 2014-03-18 ENCOUNTER — Ambulatory Visit (INDEPENDENT_AMBULATORY_CARE_PROVIDER_SITE_OTHER): Payer: Medicaid Other | Admitting: Advanced Practice Midwife

## 2014-03-18 ENCOUNTER — Encounter: Payer: Self-pay | Admitting: Advanced Practice Midwife

## 2014-03-18 VITALS — BP 100/60 | Wt 142.0 lb

## 2014-03-18 DIAGNOSIS — O99019 Anemia complicating pregnancy, unspecified trimester: Secondary | ICD-10-CM

## 2014-03-18 DIAGNOSIS — O09299 Supervision of pregnancy with other poor reproductive or obstetric history, unspecified trimester: Secondary | ICD-10-CM

## 2014-03-18 DIAGNOSIS — Z331 Pregnant state, incidental: Secondary | ICD-10-CM

## 2014-03-18 DIAGNOSIS — Z3480 Encounter for supervision of other normal pregnancy, unspecified trimester: Secondary | ICD-10-CM

## 2014-03-18 DIAGNOSIS — Z1389 Encounter for screening for other disorder: Secondary | ICD-10-CM

## 2014-03-18 LAB — POCT URINALYSIS DIPSTICK
Blood, UA: NEGATIVE
Glucose, UA: NEGATIVE
Ketones, UA: NEGATIVE
LEUKOCYTES UA: NEGATIVE
NITRITE UA: NEGATIVE
Protein, UA: NEGATIVE

## 2014-03-18 NOTE — Progress Notes (Signed)
GBS collected.    No c/o at this time.  Routine questions about pregnancy answered.  F/U in 1 weeks for Low-risk ob appt .

## 2014-03-18 NOTE — Addendum Note (Signed)
Addended by: Colen DarlingYOUNG, Yasmina Chico S on: 03/18/2014 04:54 PM   Modules accepted: Orders

## 2014-03-20 LAB — STREP B DNA PROBE: STREP GROUP B AG: NEGATIVE

## 2014-03-25 ENCOUNTER — Ambulatory Visit (INDEPENDENT_AMBULATORY_CARE_PROVIDER_SITE_OTHER): Payer: Medicaid Other | Admitting: Advanced Practice Midwife

## 2014-03-25 VITALS — BP 100/54 | Wt 142.5 lb

## 2014-03-25 DIAGNOSIS — Z1389 Encounter for screening for other disorder: Secondary | ICD-10-CM

## 2014-03-25 DIAGNOSIS — O09299 Supervision of pregnancy with other poor reproductive or obstetric history, unspecified trimester: Secondary | ICD-10-CM

## 2014-03-25 DIAGNOSIS — O99019 Anemia complicating pregnancy, unspecified trimester: Secondary | ICD-10-CM

## 2014-03-25 DIAGNOSIS — Z3480 Encounter for supervision of other normal pregnancy, unspecified trimester: Secondary | ICD-10-CM

## 2014-03-25 DIAGNOSIS — Z331 Pregnant state, incidental: Secondary | ICD-10-CM

## 2014-03-25 LAB — POCT URINALYSIS DIPSTICK
Blood, UA: NEGATIVE
Glucose, UA: NEGATIVE
Ketones, UA: NEGATIVE
LEUKOCYTES UA: NEGATIVE
NITRITE UA: NEGATIVE
PROTEIN UA: NEGATIVE

## 2014-03-25 NOTE — Progress Notes (Signed)
GBS neg.  No c/o today other than normal pregnancy complaints.  F/u 1 week.

## 2014-04-01 ENCOUNTER — Ambulatory Visit (INDEPENDENT_AMBULATORY_CARE_PROVIDER_SITE_OTHER): Payer: Medicaid Other | Admitting: Women's Health

## 2014-04-01 ENCOUNTER — Encounter: Payer: Self-pay | Admitting: Women's Health

## 2014-04-01 VITALS — BP 110/60 | Wt 144.5 lb

## 2014-04-01 DIAGNOSIS — O99019 Anemia complicating pregnancy, unspecified trimester: Secondary | ICD-10-CM

## 2014-04-01 DIAGNOSIS — Z331 Pregnant state, incidental: Secondary | ICD-10-CM

## 2014-04-01 DIAGNOSIS — Z3480 Encounter for supervision of other normal pregnancy, unspecified trimester: Secondary | ICD-10-CM

## 2014-04-01 DIAGNOSIS — O09299 Supervision of pregnancy with other poor reproductive or obstetric history, unspecified trimester: Secondary | ICD-10-CM

## 2014-04-01 DIAGNOSIS — O368190 Decreased fetal movements, unspecified trimester, not applicable or unspecified: Secondary | ICD-10-CM

## 2014-04-01 DIAGNOSIS — Z1389 Encounter for screening for other disorder: Secondary | ICD-10-CM

## 2014-04-01 LAB — POCT URINALYSIS DIPSTICK
Glucose, UA: NEGATIVE
Ketones, UA: NEGATIVE
Leukocytes, UA: NEGATIVE
NITRITE UA: NEGATIVE
PROTEIN UA: NEGATIVE
RBC UA: NEGATIVE

## 2014-04-01 NOTE — Progress Notes (Signed)
Reports decreased fm. Reactive NST, good mvmt per pt since on efm. Denies regular uc's, lof, vb, uti s/s.  No complaints.  Requests SVE: cx very far to maternal Rt/baby mainly lying to maternal left, 1.5/70/-1, soft. Reviewed labor s/s, fkc.  All questions answered. F/U in 1wk for visit.

## 2014-04-01 NOTE — Patient Instructions (Signed)
Call the office or go to [facility] if:  You begin to have strong, frequent contractions  Your water breaks.  Sometimes it is a big gush of fluid, sometimes it is just a trickle that keeps getting your panties wet or running down your legs  You have vaginal bleeding.  It is normal to have a small amount of spotting if your cervix was checked.   You don't feel your baby moving like normal.  If you don't, get you something to eat and drink and lay down and focus on feeling your baby move.  You should feel at least 10 movements in 2 hours.  If you don't, you should call the office or [facility].   Fetal Movement Counts Patient Name: __________________________________________________ Patient Due Date: ____________________ Performing a fetal movement count is highly recommended in high-risk pregnancies, but it is good for every pregnant woman to do. Your caregiver may ask you to start counting fetal movements at 28 weeks of the pregnancy. Fetal movements often increase:  After eating a full meal.  After physical activity.  After eating or drinking something sweet or cold.  At rest. Pay attention to when you feel the baby is most active. This will help you notice a pattern of your baby's sleep and wake cycles and what factors contribute to an increase in fetal movement. It is important to perform a fetal movement count at the same time each day when your baby is normally most active.  HOW TO COUNT FETAL MOVEMENTS 1. Find a quiet and comfortable area to sit or lie down on your left side. Lying on your left side provides the best blood and oxygen circulation to your baby. 2. Write down the day and time on a sheet of paper or in a journal. 3. Start counting kicks, flutters, swishes, rolls, or jabs in a 2 hour period. You should feel at least 10 movements within 2 hours. 4. If you do not feel 10 movements in 2 hours, wait 2 3 hours and count again. Look for a change in the pattern or  not enough counts in 2 hours. SEEK MEDICAL CARE IF:  You feel less than 10 counts in 2 hours, tried twice.  There is no movement in over an hour.  The pattern is changing or taking longer each day to reach 10 counts in 2 hours.  You feel the baby is not moving as he or she usually does. Date: ____________ Movements: ____________ Start time: ____________ Doreatha MartinFinish time: ____________  Date: ____________ Movements: ____________ Start time: ____________ Doreatha MartinFinish time: ____________ Date: ____________ Movements: ____________ Start time: ____________ Doreatha MartinFinish time: ____________ Date: ____________ Movements: ____________ Start time: ____________ Doreatha MartinFinish time: ____________ Date: ____________ Movements: ____________ Start time: ____________ Doreatha MartinFinish time: ____________ Date: ____________ Movements: ____________ Start time: ____________ Doreatha MartinFinish time: ____________ Date: ____________ Movements: ____________ Start time: ____________ Doreatha MartinFinish time: ____________ Date: ____________ Movements: ____________ Start time: ____________ Doreatha MartinFinish time: ____________  Date: ____________ Movements: ____________ Start time: ____________ Doreatha MartinFinish time: ____________ Date: ____________ Movements: ____________ Start time: ____________ Doreatha MartinFinish time: ____________ Date: ____________ Movements: ____________ Start time: ____________ Doreatha MartinFinish time: ____________ Date: ____________ Movements: ____________ Start time: ____________ Doreatha MartinFinish time: ____________ Date: ____________ Movements: ____________ Start time: ____________ Doreatha MartinFinish time: ____________ Date: ____________ Movements: ____________ Start time: ____________ Doreatha MartinFinish time: ____________ Date: ____________ Movements: ____________ Start time: ____________ Doreatha MartinFinish time: ____________  Date: ____________ Movements: ____________ Start time: ____________ Doreatha MartinFinish time: ____________ Date: ____________ Movements: ____________ Start time: ____________ Doreatha MartinFinish time: ____________ Date: ____________ Movements:  ____________  Start time: ____________ Doreatha Martin time: ____________ Date: ____________ Movements: ____________ Start time: ____________ Doreatha Martin time: ____________ Date: ____________ Movements: ____________ Start time: ____________ Doreatha Martin time: ____________ Date: ____________ Movements: ____________ Start time: ____________ Doreatha Martin time: ____________ Date: ____________ Movements: ____________ Start time: ____________ Doreatha Martin time: ____________  Date: ____________ Movements: ____________ Start time: ____________ Doreatha Martin time: ____________ Date: ____________ Movements: ____________ Start time: ____________ Doreatha Martin time: ____________ Date: ____________ Movements: ____________ Start time: ____________ Doreatha Martin time: ____________ Date: ____________ Movements: ____________ Start time: ____________ Doreatha Martin time: ____________ Date: ____________ Movements: ____________ Start time: ____________ Doreatha Martin time: ____________ Date: ____________ Movements: ____________ Start time: ____________ Doreatha Martin time: ____________ Date: ____________ Movements: ____________ Start time: ____________ Doreatha Martin time: ____________  Date: ____________ Movements: ____________ Start time: ____________ Doreatha Martin time: ____________ Date: ____________ Movements: ____________ Start time: ____________ Doreatha Martin time: ____________ Date: ____________ Movements: ____________ Start time: ____________ Doreatha Martin time: ____________ Date: ____________ Movements: ____________ Start time: ____________ Doreatha Martin time: ____________ Date: ____________ Movements: ____________ Start time: ____________ Doreatha Martin time: ____________ Date: ____________ Movements: ____________ Start time: ____________ Doreatha Martin time: ____________ Date: ____________ Movements: ____________ Start time: ____________ Doreatha Martin time: ____________  Date: ____________ Movements: ____________ Start time: ____________ Doreatha Martin time: ____________ Date: ____________ Movements: ____________ Start time: ____________ Doreatha Martin  time: ____________ Date: ____________ Movements: ____________ Start time: ____________ Doreatha Martin time: ____________ Date: ____________ Movements: ____________ Start time: ____________ Doreatha Martin time: ____________ Date: ____________ Movements: ____________ Start time: ____________ Doreatha Martin time: ____________ Date: ____________ Movements: ____________ Start time: ____________ Doreatha Martin time: ____________ Date: ____________ Movements: ____________ Start time: ____________ Doreatha Martin time: ____________  Date: ____________ Movements: ____________ Start time: ____________ Doreatha Martin time: ____________ Date: ____________ Movements: ____________ Start time: ____________ Doreatha Martin time: ____________ Date: ____________ Movements: ____________ Start time: ____________ Doreatha Martin time: ____________ Date: ____________ Movements: ____________ Start time: ____________ Doreatha Martin time: ____________ Date: ____________ Movements: ____________ Start time: ____________ Doreatha Martin time: ____________ Date: ____________ Movements: ____________ Start time: ____________ Doreatha Martin time: ____________ Date: ____________ Movements: ____________ Start time: ____________ Doreatha Martin time: ____________  Date: ____________ Movements: ____________ Start time: ____________ Doreatha Martin time: ____________ Date: ____________ Movements: ____________ Start time: ____________ Doreatha Martin time: ____________ Date: ____________ Movements: ____________ Start time: ____________ Doreatha Martin time: ____________ Date: ____________ Movements: ____________ Start time: ____________ Doreatha Martin time: ____________ Date: ____________ Movements: ____________ Start time: ____________ Doreatha Martin time: ____________ Date: ____________ Movements: ____________ Start time: ____________ Doreatha Martin time: ____________ Document Released: 01/10/2007 Document Revised: 11/27/2012 Document Reviewed: 10/07/2012 [employer] Patient Information 2014 [employer] Braxton Hicks Contractions Pregnancy is commonly associated with  contractions of the uterus throughout the pregnancy. Towards the end of pregnancy (32 to 34 weeks), these contractions [facility] Willa Rough) can develop more often and may become more forceful. This is not true labor because these contractions do not result in opening (dilatation) and thinning of the cervix. They are sometimes difficult to tell apart from true labor because these contractions can be forceful and people have different pain tolerances. You should not feel embarrassed if you go to the hospital with false labor. Sometimes, the only way to tell if you are in true labor is for your caregiver to follow the changes in the cervix. How to tell the difference between true and false labor:  False labor.  The contractions of false labor are usually shorter, irregular and not as hard as those of true labor.  They are often felt in the front of the lower abdomen and in the groin.  They may leave with walking around or changing positions while lying down.  They get weaker and are shorter lasting as time goes on.  These contractions are  usually irregular.  They do not usually become progressively stronger, regular and closer together as with true labor.  True labor.  Contractions in true labor last 30 to 70 seconds, become very regular, usually become more intense, and increase in frequency.  They do not go away with walking.  The discomfort is usually felt in the top of the uterus and spreads to the lower abdomen and low back.  True labor can be determined by your caregiver with an exam. This will show that the cervix is dilating and getting thinner. If there are no prenatal problems or other health problems associated with the pregnancy, it is completely safe to be sent home with false labor and await the onset of true labor. HOME CARE INSTRUCTIONS   Keep up with your usual exercises and instructions.  Take medications as directed.  Keep your regular prenatal appointment.  Eat and  drink lightly if you think you are going into labor.  If BH contractions are making you uncomfortable:  Change your activity position from lying down or resting to walking/walking to resting.  Sit and rest in a tub of warm water.  Drink 2 to 3 glasses of water. Dehydration may cause B-H contractions.  Do slow and deep breathing several times an hour. SEEK IMMEDIATE MEDICAL CARE IF:   Your contractions continue to become stronger, more regular, and closer together.  You have a gushing, burst or leaking of fluid from the vagina.  An oral temperature above 102 F (38.9 C) develops.  You have passage of blood-tinged mucus.  You develop vaginal bleeding.  You develop continuous belly (abdominal) pain.  You have low back pain that you never had before.  You feel the baby's head pushing down causing pelvic pressure.  The baby is not moving as much as it used to. Document Released: 12/11/2005 Document Revised: 03/04/2012 Document Reviewed: 09/22/2013 [facility] Patient Information 2014 [place], [place].

## 2014-04-09 ENCOUNTER — Encounter: Payer: Self-pay | Admitting: Obstetrics and Gynecology

## 2014-04-09 ENCOUNTER — Ambulatory Visit (INDEPENDENT_AMBULATORY_CARE_PROVIDER_SITE_OTHER): Payer: Medicaid Other | Admitting: Obstetrics and Gynecology

## 2014-04-09 VITALS — BP 124/78 | Wt 142.8 lb

## 2014-04-09 DIAGNOSIS — O99019 Anemia complicating pregnancy, unspecified trimester: Secondary | ICD-10-CM

## 2014-04-09 DIAGNOSIS — O09299 Supervision of pregnancy with other poor reproductive or obstetric history, unspecified trimester: Secondary | ICD-10-CM

## 2014-04-09 DIAGNOSIS — Z3480 Encounter for supervision of other normal pregnancy, unspecified trimester: Secondary | ICD-10-CM

## 2014-04-09 DIAGNOSIS — Z1389 Encounter for screening for other disorder: Secondary | ICD-10-CM

## 2014-04-09 DIAGNOSIS — Z331 Pregnant state, incidental: Secondary | ICD-10-CM

## 2014-04-09 LAB — POCT URINALYSIS DIPSTICK
Glucose, UA: NEGATIVE
Ketones, UA: NEGATIVE
Leukocytes, UA: NEGATIVE
Nitrite, UA: NEGATIVE
Protein, UA: NEGATIVE
RBC UA: NEGATIVE

## 2014-04-09 NOTE — Progress Notes (Signed)
2574w6d female presents for routine prenatal visit. She reports occasional back pain described as cramping. Cervix is closed, long and posterior. Baby's head is low. But cervix remains very posterior.and long

## 2014-04-11 ENCOUNTER — Encounter (HOSPITAL_COMMUNITY): Payer: Self-pay

## 2014-04-11 ENCOUNTER — Inpatient Hospital Stay (HOSPITAL_COMMUNITY)
Admission: AD | Admit: 2014-04-11 | Discharge: 2014-04-13 | DRG: 775 | Disposition: A | Discharge status: Home / Self Care | Payer: Medicaid Other | Source: Ambulatory Visit | Attending: Obstetrics & Gynecology | Admitting: Obstetrics & Gynecology

## 2014-04-11 DIAGNOSIS — O9912 Other diseases of the blood and blood-forming organs and certain disorders involving the immune mechanism complicating childbirth: Principal | ICD-10-CM

## 2014-04-11 DIAGNOSIS — D696 Thrombocytopenia, unspecified: Secondary | ICD-10-CM | POA: Diagnosis present

## 2014-04-11 DIAGNOSIS — Z87891 Personal history of nicotine dependence: Secondary | ICD-10-CM

## 2014-04-11 DIAGNOSIS — Z3480 Encounter for supervision of other normal pregnancy, unspecified trimester: Secondary | ICD-10-CM

## 2014-04-11 DIAGNOSIS — D689 Coagulation defect, unspecified: Secondary | ICD-10-CM

## 2014-04-11 DIAGNOSIS — IMO0001 Reserved for inherently not codable concepts without codable children: Secondary | ICD-10-CM

## 2014-04-11 DIAGNOSIS — R110 Nausea: Secondary | ICD-10-CM

## 2014-04-11 HISTORY — DX: Other specified health status: Z78.9

## 2014-04-11 LAB — ABO/RH: ABO/RH(D): B POS

## 2014-04-11 LAB — CBC
HEMATOCRIT: 34.2 % — AB (ref 36.0–46.0)
HEMOGLOBIN: 11 g/dL — AB (ref 12.0–15.0)
MCH: 25.3 pg — ABNORMAL LOW (ref 26.0–34.0)
MCHC: 32.2 g/dL (ref 30.0–36.0)
MCV: 78.8 fL (ref 78.0–100.0)
Platelets: 128 10*3/uL — ABNORMAL LOW (ref 150–400)
RBC: 4.34 MIL/uL (ref 3.87–5.11)
RDW: 14.5 % (ref 11.5–15.5)
WBC: 10 10*3/uL (ref 4.0–10.5)

## 2014-04-11 LAB — TYPE AND SCREEN
ABO/RH(D): B POS
ANTIBODY SCREEN: NEGATIVE

## 2014-04-11 LAB — RPR

## 2014-04-11 MED ORDER — LACTATED RINGERS IV SOLN
INTRAVENOUS | Status: DC
  Administered 2014-04-11: 11:00:00 via INTRAVENOUS

## 2014-04-11 MED ORDER — ACETAMINOPHEN 325 MG PO TABS: 650.0000 mg | ORAL_TABLET | ORAL | Status: DC | PRN

## 2014-04-11 MED ORDER — TETANUS-DIPHTH-ACELL PERTUSSIS 5-2.5-18.5 LF-MCG/0.5 IM SUSP
0.5000 mL | Freq: Once | INTRAMUSCULAR | Status: AC
  Administered 2014-04-12: 0.5 mL via INTRAMUSCULAR

## 2014-04-11 MED ORDER — TERBUTALINE SULFATE 1 MG/ML IJ SOLN: 0.2500 mg | Freq: Once | INTRAMUSCULAR | Status: DC | PRN

## 2014-04-11 MED ORDER — DIPHENHYDRAMINE HCL 25 MG PO CAPS: 25.0000 mg | ORAL_CAPSULE | Freq: Four times a day (QID) | ORAL | Status: DC | PRN

## 2014-04-11 MED ORDER — IBUPROFEN 600 MG PO TABS
600.0000 mg | ORAL_TABLET | Freq: Four times a day (QID) | ORAL | Status: DC
  Administered 2014-04-11 – 2014-04-13 (×6): 600 mg via ORAL
  Filled 2014-04-11 (×6): qty 1

## 2014-04-11 MED ORDER — LANOLIN HYDROUS EX OINT: TOPICAL_OINTMENT | CUTANEOUS | Status: DC | PRN

## 2014-04-11 MED ORDER — ONDANSETRON HCL 4 MG PO TABS: 4.0000 mg | ORAL_TABLET | ORAL | Status: DC | PRN

## 2014-04-11 MED ORDER — BISACODYL 10 MG RE SUPP: 10.0000 mg | Freq: Every day | RECTAL | Status: DC | PRN

## 2014-04-11 MED ORDER — LIDOCAINE HCL (PF) 1 % IJ SOLN
30.0000 mL | INTRAMUSCULAR | Status: DC | PRN
  Filled 2014-04-11: qty 30

## 2014-04-11 MED ORDER — FLEET ENEMA 7-19 GM/118ML RE ENEM: 1.0000 | ENEMA | RECTAL | Status: DC | PRN

## 2014-04-11 MED ORDER — BENZOCAINE-MENTHOL 20-0.5 % EX AERO: 1.0000 "application " | INHALATION_SPRAY | CUTANEOUS | Status: DC | PRN

## 2014-04-11 MED ORDER — SENNOSIDES-DOCUSATE SODIUM 8.6-50 MG PO TABS
2.0000 | ORAL_TABLET | ORAL | Status: DC
  Administered 2014-04-11 – 2014-04-12 (×2): 2 via ORAL
  Filled 2014-04-11 (×2): qty 2

## 2014-04-11 MED ORDER — ZOLPIDEM TARTRATE 5 MG PO TABS: 5.0000 mg | ORAL_TABLET | Freq: Every evening | ORAL | Status: DC | PRN

## 2014-04-11 MED ORDER — WITCH HAZEL-GLYCERIN EX PADS: 1.0000 "application " | MEDICATED_PAD | CUTANEOUS | Status: DC | PRN

## 2014-04-11 MED ORDER — SIMETHICONE 80 MG PO CHEW: 80.0000 mg | CHEWABLE_TABLET | ORAL | Status: DC | PRN

## 2014-04-11 MED ORDER — OXYCODONE-ACETAMINOPHEN 5-325 MG PO TABS: 1.0000 | ORAL_TABLET | ORAL | Status: DC | PRN

## 2014-04-11 MED ORDER — DIBUCAINE 1 % RE OINT: 1.0000 "application " | TOPICAL_OINTMENT | RECTAL | Status: DC | PRN

## 2014-04-11 MED ORDER — SODIUM CHLORIDE 0.9 % IJ SOLN: 3.0000 mL | Freq: Two times a day (BID) | INTRAMUSCULAR | Status: DC

## 2014-04-11 MED ORDER — SODIUM CHLORIDE 0.9 % IJ SOLN: 3.0000 mL | INTRAMUSCULAR | Status: DC | PRN

## 2014-04-11 MED ORDER — FLEET ENEMA 7-19 GM/118ML RE ENEM: 1.0000 | ENEMA | Freq: Every day | RECTAL | Status: DC | PRN

## 2014-04-11 MED ORDER — OXYTOCIN 40 UNITS IN LACTATED RINGERS INFUSION - SIMPLE MED
1.0000 m[IU]/min | INTRAVENOUS | Status: DC
  Administered 2014-04-11: 2 m[IU]/min via INTRAVENOUS

## 2014-04-11 MED ORDER — IBUPROFEN 600 MG PO TABS: 600.0000 mg | ORAL_TABLET | Freq: Four times a day (QID) | ORAL | Status: DC | PRN

## 2014-04-11 MED ORDER — OXYTOCIN BOLUS FROM INFUSION
500.0000 mL | INTRAVENOUS | Status: DC
  Administered 2014-04-11: 500 mL via INTRAVENOUS

## 2014-04-11 MED ORDER — OXYTOCIN 40 UNITS IN LACTATED RINGERS INFUSION - SIMPLE MED: 62.5000 mL/h | INTRAVENOUS | Status: DC | PRN

## 2014-04-11 MED ORDER — ONDANSETRON HCL 4 MG/2ML IJ SOLN: 4.0000 mg | INTRAMUSCULAR | Status: DC | PRN

## 2014-04-11 MED ORDER — ONDANSETRON HCL 4 MG/2ML IJ SOLN: 4.0000 mg | Freq: Four times a day (QID) | INTRAMUSCULAR | Status: DC | PRN

## 2014-04-11 MED ORDER — LACTATED RINGERS IV SOLN: 500.0000 mL | INTRAVENOUS | Status: DC | PRN

## 2014-04-11 MED ORDER — PRENATAL MULTIVITAMIN CH
1.0000 | ORAL_TABLET | Freq: Every day | ORAL | Status: DC
  Administered 2014-04-12: 1 via ORAL
  Filled 2014-04-11: qty 1

## 2014-04-11 MED ORDER — HYDROXYZINE HCL 50 MG PO TABS
50.0000 mg | ORAL_TABLET | Freq: Four times a day (QID) | ORAL | Status: DC | PRN
  Filled 2014-04-11: qty 1

## 2014-04-11 MED ORDER — FENTANYL CITRATE 0.05 MG/ML IJ SOLN: 100.0000 ug | INTRAMUSCULAR | Status: DC | PRN

## 2014-04-11 MED ORDER — MEASLES, MUMPS & RUBELLA VAC ~~LOC~~ INJ: 0.5000 mL | INJECTION | Freq: Once | SUBCUTANEOUS | Status: DC

## 2014-04-11 MED ORDER — SODIUM CHLORIDE 0.9 % IV SOLN: 250.0000 mL | INTRAVENOUS | Status: DC | PRN

## 2014-04-11 MED ORDER — CITRIC ACID-SODIUM CITRATE 334-500 MG/5ML PO SOLN: 30.0000 mL | ORAL | Status: DC | PRN

## 2014-04-11 MED ORDER — OXYTOCIN 40 UNITS IN LACTATED RINGERS INFUSION - SIMPLE MED
62.5000 mL/h | INTRAVENOUS | Status: DC
  Administered 2014-04-11: 62.5 mL/h via INTRAVENOUS
  Filled 2014-04-11: qty 1000

## 2014-04-11 NOTE — MAU Note (Signed)
[facility]

## 2014-04-11 NOTE — H&P (Signed)
Patricia Vaughn is a 23 y.o. [phone] female at 5264w1d by 12.4wk u/s, presenting w/ report of uc's beginning at 0530 this am and progressively worsening throughout am.   Reports active fetal movement, contractions: regular, vaginal bleeding: none, membranes: intact. Initiated prenatal care at [facility] at 17.6wks wks.    Prenatal History/Complications: Late care @ 17.6wks, slightly decreased platelets: 149-->148  Past Medical History: Past Medical History  Diagnosis Date  . Miscarriage, threatened, early pregnancy 03/28/2013  . Supervision of low-risk pregnancy 11/06/2013  . Nausea 11/06/2013    Past Surgical History: History reviewed. No pertinent past surgical history.  Obstetrical History: OB History   Grav Para Term Preterm Abortions TAB SAB Ect Mult Living   4 1 1  0 2 0 2 0 0 1      Social History: History   Social History  . Marital Status: Divorced    Spouse Name: N/A    Number of Children: N/A  . Years of Education: N/A   Social History Main Topics  . Smoking status: Former Games developermoker  . Smokeless tobacco: Never Used  . Alcohol Use: No  . Drug Use: No  . Sexual Activity: Yes    Birth Control/ Protection: None   Other Topics Concern  . None   Social History Narrative  . None    Family History: Family History  Problem Relation Age of Onset  . Diabetes Other   . Hypertension Other   . Hyperlipidemia Father   . Cancer Maternal Grandfather     Allergies: No Known Allergies  Prescriptions prior to admission  Medication Sig Dispense Refill  . Prenatal Vit-Fe Fumarate-FA (PRENATAL VITAMINS PLUS) 27-1 MG TABS Take 1 tablet by mouth daily.      . [DISCONTINUED] Prenatal Vit-Fe Fumarate-FA (PRENATAL VITAMINS PLUS) 27-1 MG TABS Take 1 tablet by mouth daily.  30 tablet  11     Review of Systems  Pertinent pos/neg as indicated in HPI    Blood pressure 119/71, pulse 86, temperature 98.7 F (37.1 C), resp. rate 18, height 5\' 6"  (1.676 m), weight 62.596 kg (138 lb), last  menstrual period 01/21/2013. General appearance: alert, cooperative and mild distress Lungs: clear to auscultation bilaterally Heart: regular rate and rhythm Abdomen: gravid, soft, non-tender Extremities: No edema DTR's 2+  Fetal monitoring: FHR: 140 bpm, variability: moderate,  Accelerations: Present,  decelerations:  Absent Uterine activity: q 3mins  Dilation: 4 Effacement (%): 80 Station: -2 Exam by:: kbooker, cnm, posterior, soft, mod bloody show on exam glove Presentation: cephalic  Multip, has made cervical change from 3/80/-3-->3.5/80/-3-->4/80/-2 over last 2 hours  Prenatal labs: ABO, Rh: B/POS/-- (11/13 1605) Antibody: NEG (01/21 0943) Rubella:  Immune RPR: NON REAC (01/21 0943)  HBsAg: NEGATIVE (11/13 1605)  HIV: NON REACTIVE (01/21 0943)  GBS: NEGATIVE (03/25 1717)   2 hr Glucola: normal 74/123/96 Genetic screening:  AFP normal Anatomy US: normal female  No results found for this or any previous visit (from the past 24 hour(s)).   Assessment:  5264w1d SIUP  G4P1021  Multip early/active labor  Cat 1 FHR  GBS NEGATIVE (03/25 1717)  Plan:  Admit to BS  IV pain meds/epidural prn active labor  Expectant management, pitocin if needed for augmentation  Anticipate NSVD   Plans to breast/bottle feed  Depo for contraception  Marge DuncansKimberly Randall Tysen Roesler CNM, WHNP-BC 04/11/2014, 10:54 AM

## 2014-04-11 NOTE — Progress Notes (Addendum)
Patient ID: Patricia Vaughn, female   DOB: August 07, 1991, 23 y.o.   MRN: [id number] Patricia Vaughn is a 23 y.o. [id number] at 6041w1d  admitted for early/active labor  Subjective: Coping well w/ uc's, getting on birthing ball, ambulating, wants to do something to make things progress quicker. Discussed augmenting w/ pitocin vs. arom- pt decides for arom.   Objective: BP 110/62  Pulse 81  Temp(Src) 97.9 F (36.6 C) (Oral)  Resp 20  Ht 5\' 6"  (1.676 m)  Wt 62.596 kg (138 lb)  BMI 22.28 kg/m2  LMP 01/21/2013    FHT:  FHR: 135 bpm, variability: moderate,  accelerations:  Present,  decelerations:  Absent UC:   regular, every 2-4 minutes  SVE:  5.5/80/0, soft, posterior AROM small amount clear fluid  Labs: Lab Results  Component Value Date   WBC 10.0 04/11/2014   HGB 11.0* 04/11/2014   HCT 34.2* 04/11/2014   MCV 78.8 04/11/2014   PLT 128* 04/11/2014    Assessment / Plan: Spontaneous labor, progressing normally, now arom'd  Labor: Progressing normally Fetal Wellbeing:  Category I Pain Control:  Labor support without medications, doesn't plan for epidural Pre-eclampsia: n/a I/D:  n/a Anticipated MOD:  NSVD  Marge DuncansKimberly Randall Naiyana Barbian CNM, WHNP-BC 04/11/2014, 3:44 PM

## 2014-04-11 NOTE — H&P (Signed)
Attestation of Attending Supervision of Advanced Practitioner (CNM/NP): Evaluation and management procedures were performed by the Advanced Practitioner under my supervision and collaboration. I have reviewed the Advanced Practitioner's note and chart, and I agree with the management and plan.  Lynnox Girten H Monte Zinni 12:29 PM   

## 2014-04-11 NOTE — Progress Notes (Signed)
Patient ID: Patricia Vaughn, female   DOB: [birth year], 23 y.o.   MRN: [id number] Patricia Vaughn is a 23 y.o. [id number] at 6528w1d  admitted for early/active labor  Subjective: Uncomfortable, breathing/coping well w/ uc's,   Objective: BP 103/66  Pulse 95  Temp(Src) 98.1 F (36.7 C) (Oral)  Resp 20  Ht 5\' 6"  (1.676 m)  Wt 62.596 kg (138 lb)  BMI 22.28 kg/m2  LMP 01/21/2013    FHT:  FHR: 135 bpm, variability: moderate,  accelerations:  Present,  decelerations:  Absent UC:   regular, every 5 minutes  SVE:   Dilation: 6 Effacement (%): 90 Station: 0 Exam by:: Juaquina Machnik, very soft, posterior  Labs: Lab Results  Component Value Date   WBC 10.0 04/11/2014   HGB 11.0* 04/11/2014   HCT 34.2* 04/11/2014   MCV 78.8 04/11/2014   PLT 128* 04/11/2014    Assessment / Plan: Spontaneous labor, progressing slowly, will augment w/ pitocin per protocol  Labor: progressing slowly Fetal Wellbeing:  Category I Pain Control:  Labor support without medications Pre-eclampsia: n/a I/D:  n/a Anticipated MOD:  NSVD  Cheron EveryKimberly Randall Carrigan Delafuente CNM, WHNP-BC 04/11/2014, 6:08 PM

## 2014-04-11 NOTE — MAU Note (Signed)
Pt presents with complaints of contractions since 5am. Denies any vaginal bleeding or LOF

## 2014-04-12 NOTE — Progress Notes (Signed)
Post Partum Day 1 Subjective: Eating, drinking, voiding, ambulating well.  +flatus.  Lochia and pain wnl.  Denies dizziness, lightheadedness, or sob. No complaints. Birthed late yesterday evening, so would like to stay until tomorrow am.   Objective: Blood pressure 98/62, pulse 84, temperature 97.8 F (36.6 C), temperature source Oral, resp. rate 16, height 5\' 6"  (1.676 m), weight 62.596 kg (138 lb), last menstrual period 01/21/2013, SpO2 99.00%, unknown if currently breastfeeding.  Physical Exam:  General: alert, cooperative and no distress Lochia: appropriate Uterine Fundus: firm Incision: n/a DVT Evaluation: No evidence of DVT seen on physical exam. Negative Homan's sign. No cords or calf tenderness. No significant calf/ankle edema.   Recent Labs  04/11/14 1055  HGB 11.0*  HCT 34.2*    Assessment/Plan: Plan for discharge tomorrow, Breastfeeding, Lactation consult and Contraception depo   LOS: 1 day   Patricia Vaughn 04/12/2014, 7:27 AM

## 2014-04-13 ENCOUNTER — Other Ambulatory Visit: Payer: Self-pay | Admitting: Adult Health

## 2014-04-13 MED ORDER — IBUPROFEN 600 MG PO TABS: 600.0000 mg | ORAL_TABLET | Freq: Four times a day (QID) | ORAL | Status: DC

## 2014-04-13 NOTE — Progress Notes (Signed)
Ur chart review completed.  

## 2014-04-13 NOTE — Discharge Summary (Signed)
Obstetric Discharge Summary Reason for Admission: onset of labor Prenatal Procedures: NST Intrapartum Procedures: spontaneous vaginal delivery Postpartum Procedures: none Complications-Operative and Postpartum: none Hemoglobin  Date Value Ref Range Status  04/11/2014 11.0* 12.0 - 15.0 g/dL Final     HCT  Date Value Ref Range Status  04/11/2014 34.2* 36.0 - 46.0 % Final   Pt presented in active labor at term and progressed to deliver a liveborn female via NSVD. Postpartum care has been uncomplicated.  She is breast/bottle feeding and desires depo for contraception.   Delivery Note At 6:53 PM a viable female was delivered via Vaginal, Spontaneous Delivery (Presentation: Right Occiput Anterior).  APGAR: 9, 9; weight:pending .   Placenta status: Intact, Spontaneous.  Cord: 3 vessels with the following complications: None.  Infant birthed without complications and was placed directly on mom's abdomen for bonding/skin-to-skin. Cord allowed to stop pulsing, and was clamped x 2, and cut by fob.   Anesthesia: None  Episiotomy: None Lacerations: none Suture Repair: n/a Est. Blood Loss (mL): 300  Placenta to: BS Feeding: breast/bottle Circ: n/a Contraception: depo   Mom to postpartum.  Baby to Couplet care / Skin to Skin.  Patricia Vaughn 04/11/2014, 7:12 PM   Physical Exam:  General: alert, cooperative and no distress Lochia: appropriate Uterine Fundus: firm Incision: na DVT Evaluation: No evidence of DVT seen on physical exam. No cords or calf tenderness. No significant calf/ankle edema.  Discharge Diagnoses: Term Pregnancy-delivered  Discharge Information: Date: 04/13/2014 Activity: pelvic rest Diet: routine Medications: PNV and Ibuprofen Condition: stable Instructions: refer to practice specific booklet Discharge to: home Follow-up Information   [facility].   Specialty:  Obstetrics and Gynecology   Contact information:   [address] [place] [address] [phone]      Newborn Data: Live born female  Birth Weight: 7 lb 11.6 oz (3505 g) APGAR: 9, 9  Home with mother.  Patricia Vaughn 04/13/2014, 7:01 AM

## 2014-04-13 NOTE — Discharge Summary (Signed)
Attestation of Attending Supervision of Fellow: Evaluation and management procedures were performed by the Fellow under my supervision and collaboration.  I have reviewed the Fellow's note and chart, and I agree with the management and plan.    

## 2014-04-13 NOTE — Discharge Instructions (Signed)

## 2014-04-13 NOTE — Lactation Note (Signed)
This note was copied from the chart of Patricia Gibson RampMaira Elmes. Lactation Consultation Note  Patient Name: Patricia Vaughn ZOXWR'UToday's Date: 04/13/2014 Reason for consult: Follow-up assessment- per mom nipples are tender , LC assessed  Both nipples , no breakdown noted , just dark pinky, and swollen areolas , erect nipples. LC reviewed basics - breast massage, hand express, ( steady flow noted with mom demo ), pre-pump  If needed to make the nipple and areola more elastic for a deeper latch, breast compressions with latch  and intermittent with feeding. Mom already has comfort gels, hand pump and LC instructed mom on the  Use of shells to enhance the the nipple to be more erect and areola more compressible at the base. Reviewed sore nipple tx and engorgement prevention and tx. Mom aware of the [facility] and [facility].    Maternal Data Has patient been taught Hand Expression?: Yes  Feeding Feeding Type:  (per mom recently fed at 1045 ) Length of feed: 10 min (per mom )  LATCH Score/Interventions          Comfort (Breast/Nipple):  (per mom nipples are tender , see LC note )     Intervention(s): Breastfeeding basics reviewed     Lactation Tools Discussed/Used Tools: Shells;Comfort gels;Pump Shell Type: Inverted Breast pump type: Manual (per mom familiar with set up )   Consult Status Consult Status: Complete    Patricia Vaughn 04/13/2014, 11:22 AM

## 2014-04-16 ENCOUNTER — Encounter: Payer: Medicaid Other | Admitting: Obstetrics & Gynecology

## 2014-10-26 ENCOUNTER — Encounter (HOSPITAL_COMMUNITY): Payer: Self-pay

## 2015-04-21 ENCOUNTER — Encounter: Payer: Self-pay | Admitting: *Deleted

## 2015-04-21 ENCOUNTER — Encounter: Payer: Medicaid Other | Admitting: Adult Health

## 2015-09-24 ENCOUNTER — Encounter (HOSPITAL_COMMUNITY): Payer: Self-pay | Admitting: Emergency Medicine

## 2015-09-24 ENCOUNTER — Emergency Department (HOSPITAL_COMMUNITY)
Admission: EM | Admit: 2015-09-24 | Discharge: 2015-09-24 | Disposition: A | Discharge status: Home / Self Care | Payer: BLUE CROSS/BLUE SHIELD | Attending: Emergency Medicine | Admitting: Emergency Medicine

## 2015-09-24 DIAGNOSIS — R6883 Chills (without fever): Secondary | ICD-10-CM | POA: Diagnosis not present

## 2015-09-24 DIAGNOSIS — R197 Diarrhea, unspecified: Secondary | ICD-10-CM | POA: Diagnosis not present

## 2015-09-24 DIAGNOSIS — R110 Nausea: Secondary | ICD-10-CM | POA: Diagnosis not present

## 2015-09-24 DIAGNOSIS — Z3202 Encounter for pregnancy test, result negative: Secondary | ICD-10-CM | POA: Insufficient documentation

## 2015-09-24 DIAGNOSIS — R1030 Lower abdominal pain, unspecified: Secondary | ICD-10-CM | POA: Insufficient documentation

## 2015-09-24 DIAGNOSIS — Z87891 Personal history of nicotine dependence: Secondary | ICD-10-CM | POA: Diagnosis not present

## 2015-09-24 LAB — COMPREHENSIVE METABOLIC PANEL
ALT: 11 U/L — ABNORMAL LOW (ref 14–54)
ANION GAP: 3 — AB (ref 5–15)
AST: 18 U/L (ref 15–41)
Albumin: 3.9 g/dL (ref 3.5–5.0)
Alkaline Phosphatase: 62 U/L (ref 38–126)
BUN: 9 mg/dL (ref 6–20)
CHLORIDE: 108 mmol/L (ref 101–111)
CO2: 24 mmol/L (ref 22–32)
Calcium: 8.3 mg/dL — ABNORMAL LOW (ref 8.9–10.3)
Creatinine, Ser: 0.61 mg/dL (ref 0.44–1.00)
Glucose, Bld: 108 mg/dL — ABNORMAL HIGH (ref 65–99)
POTASSIUM: 3.4 mmol/L — AB (ref 3.5–5.1)
Sodium: 135 mmol/L (ref 135–145)
Total Bilirubin: 0.7 mg/dL (ref 0.3–1.2)
Total Protein: 7.1 g/dL (ref 6.5–8.1)

## 2015-09-24 LAB — URINALYSIS, ROUTINE W REFLEX MICROSCOPIC
Bilirubin Urine: NEGATIVE
Glucose, UA: NEGATIVE mg/dL
Hgb urine dipstick: NEGATIVE
KETONES UR: NEGATIVE mg/dL
NITRITE: NEGATIVE
PH: 7 (ref 5.0–8.0)
Protein, ur: NEGATIVE mg/dL
SPECIFIC GRAVITY, URINE: 1.01 (ref 1.005–1.030)
Urobilinogen, UA: 0.2 mg/dL (ref 0.0–1.0)

## 2015-09-24 LAB — URINE MICROSCOPIC-ADD ON

## 2015-09-24 LAB — PREGNANCY, URINE: Preg Test, Ur: NEGATIVE

## 2015-09-24 LAB — CBC
HEMATOCRIT: 35.3 % — AB (ref 36.0–46.0)
HEMOGLOBIN: 10.7 g/dL — AB (ref 12.0–15.0)
MCH: 22.5 pg — ABNORMAL LOW (ref 26.0–34.0)
MCHC: 30.3 g/dL (ref 30.0–36.0)
MCV: 74.3 fL — AB (ref 78.0–100.0)
PLATELETS: 107 10*3/uL — AB (ref 150–400)
RBC: 4.75 MIL/uL (ref 3.87–5.11)
RDW: 16.4 % — ABNORMAL HIGH (ref 11.5–15.5)
WBC: 6.3 10*3/uL (ref 4.0–10.5)

## 2015-09-24 MED ORDER — PHENAZOPYRIDINE HCL 200 MG PO TABS: 200.0000 mg | ORAL_TABLET | Freq: Three times a day (TID) | ORAL | Status: DC

## 2015-09-24 MED ORDER — SODIUM CHLORIDE 0.9 % IV BOLUS (SEPSIS)
1000.0000 mL | Freq: Once | INTRAVENOUS | Status: AC
  Administered 2015-09-24: 1000 mL via INTRAVENOUS

## 2015-09-24 MED ORDER — KETOROLAC TROMETHAMINE 30 MG/ML IJ SOLN
15.0000 mg | Freq: Once | INTRAMUSCULAR | Status: AC
  Administered 2015-09-24: 15 mg via INTRAVENOUS
  Filled 2015-09-24: qty 1

## 2015-09-24 MED ORDER — ONDANSETRON 4 MG PO TBDP: 4.0000 mg | ORAL_TABLET | Freq: Three times a day (TID) | ORAL | Status: DC | PRN

## 2015-09-24 MED ORDER — CEPHALEXIN 500 MG PO CAPS: 500.0000 mg | ORAL_CAPSULE | Freq: Three times a day (TID) | ORAL | Status: AC

## 2015-09-24 MED ORDER — ONDANSETRON HCL 4 MG/2ML IJ SOLN
4.0000 mg | Freq: Once | INTRAMUSCULAR | Status: AC
  Administered 2015-09-24: 4 mg via INTRAVENOUS
  Filled 2015-09-24: qty 2

## 2015-09-24 NOTE — ED Notes (Signed)
Pt reports abdominal pain with diarrhea x 3 days. Pt denies emesis but reports nausea.

## 2015-09-24 NOTE — Discharge Instructions (Signed)
As discussed, your evaluation today has been largely reassuring.  But, it is important that you monitor your condition carefully, and do not hesitate to return to the ED if you develop new, or concerning changes in your condition.  You are being treated presumptively for a urinary tract infection.  Please follow-up with your physician for appropriate ongoing care.

## 2015-09-24 NOTE — ED Provider Notes (Signed)
CSN: [id number]     Arrival date & time 09/24/15  1132 History  By signing my name below, I, Freida Busman, attest that this documentation has been prepared under the direction and in the presence of Gerhard Munch, MD . Electronically Signed: Freida Busman, Scribe. 09/24/2015. 12:53 PM.   Chief Complaint  Patient presents with  . Abdominal Pain    The history is provided by the patient. No language interpreter was used.    HPI Comments:  Patricia Vaughn is a 24 y.o. female who presents to the Emergency Department complaining of constant mild suprapubic abdominal pain that has waxed and waned in severity since onset 3 days ago. She reports associated subjective fever, chills, nausea and diarrhea- frequent episodes for the last 3 days. She denies recent vomiting, h/o abdominal surgeries, ovarian cysts, recent sick contacts, recent travel and new foods. She has tried home remedy ([place] powder-unsure of ingredients) without relief. No alleviating factors noted. Her  LNMP ~ 2 weeks ago.   Past Medical History  Diagnosis Date  . Miscarriage, threatened, early pregnancy 03/28/2013  . Supervision of low-risk pregnancy 11/06/2013  . Nausea 11/06/2013  . Medical history non-contributory    Past Surgical History  Procedure Laterality Date  . No past surgeries     Family History  Problem Relation Age of Onset  . Diabetes Other   . Hypertension Other   . Hyperlipidemia Father   . Cancer Maternal Grandfather    Social History  Substance Use Topics  . Smoking status: Former Games developer  . Smokeless tobacco: Never Used  . Alcohol Use: No   OB History    Gravida Para Term Preterm AB TAB SAB Ectopic Multiple Living   0 2 0 2 0 0 2     Review of Systems  Constitutional:       Per HPI, otherwise negative  HENT:       Per HPI, otherwise negative  Respiratory:       Per HPI, otherwise negative  Cardiovascular:       Per HPI, otherwise negative  Gastrointestinal: Negative for vomiting.   Endocrine:       Negative aside from HPI  Genitourinary:       Neg aside from HPI   Musculoskeletal:       Per HPI, otherwise negative  Skin: Negative.   Neurological: Negative for syncope.    Allergies  Review of patient's allergies indicates no known allergies.  Home Medications   Prior to Admission medications   Medication Sig Start Date End Date Taking? Authorizing Provider  Prenatal Vit-Fe Fumarate-FA (PREPLUS) 27-1 MG TABS TAKE 1 TABLET BY MOUTH ONCE DAILY Patient not taking: Reported on 09/24/2015 04/13/14   Adline Potter, NP   BP 118/68 mmHg  Pulse 88  Temp(Src) 98 F (36.7 C) (Oral)  Resp 16  Ht  (1.702 m)  Wt 115 lb (52.164 kg)  BMI 18.01 kg/m2  SpO2 100%  LMP 09/10/2015  Breastfeeding? No Physical Exam  Constitutional: She is oriented to person, place, and time. She appears well-developed and well-nourished. No distress.  HENT:  Head: Normocephalic and atraumatic.  Eyes: Conjunctivae and EOM are normal.  Cardiovascular: Normal rate, regular rhythm and normal heart sounds.   Pulmonary/Chest: Effort normal and breath sounds normal. No stridor. No respiratory distress.  Abdominal: Soft. Bowel sounds are normal. She exhibits no distension. There is tenderness. There is no rebound and no guarding.   suprapubic tenderness  Musculoskeletal: She  exhibits no edema.  Neurological: She is alert and oriented to person, place, and time. No cranial nerve deficit.  Skin: Skin is warm and dry.  Psychiatric: She has a normal mood and affect.  Nursing note and vitals reviewed.   ED Course  Procedures   DIAGNOSTIC STUDIES:  Oxygen Saturation is 100% on RA, normal by my interpretation.    COORDINATION OF CARE:  12:49 PM Will order blood work, pain and nausea meds Discussed treatment plan with pt at bedside and pt agreed to plan.  Labs Review Labs Reviewed  COMPREHENSIVE METABOLIC PANEL - Abnormal; Notable for the following:    Potassium 3.4 (*)     Glucose, Bld 108 (*)    Calcium 8.3 (*)    ALT 11 (*)    Anion gap 3 (*)    All other components within normal limits  CBC - Abnormal; Notable for the following:    Hemoglobin 10.7 (*)    HCT 35.3 (*)    MCV 74.3 (*)    MCH 22.5 (*)    RDW 16.4 (*)    Platelets 107 (*)    All other components within normal limits  URINALYSIS, ROUTINE W REFLEX MICROSCOPIC (NOT AT [facility]) - Abnormal; Notable for the following:    Leukocytes, UA MODERATE (*)    All other components within normal limits  URINE MICROSCOPIC-ADD ON - Abnormal; Notable for the following:    Squamous Epithelial / LPF MANY (*)    Bacteria, UA FEW (*)    All other components within normal limits  PREGNANCY, URINE    3:09 PM Patient states that she feels better. She again denies any lateral abdominal pain, states that she is solely suprapubic tenderness. We discussed all findings at length, including suggestion from urinalysis of possible urinary tract infection. We discussed the possibility of early evaluation, with GI pathology, requiring close monitoring, return precautions.   MDM    I personally performed the services described in this documentation, which was scribed in my presence. The recorded information has been reviewed and is accurate.   Previously well young female presents with suprapubic discomfort, several episodes of diarrhea, nausea. Patient awake, alert, afebrile, with non-peritoneal abdomen. Patient has no lateral abdominal tenderness, no anorexia, no fever, no leukocytosis, and there is low suspicion for acute GI pathology. However, the patient does have nausea, diarrhea, and after discussion on return precautions, with consideration of possible early appendicitis, she was discharged in stable condition. Patient's urinalysis suggests possible UTI, though this is inconsistent with her symptoms beyond suprapubic tenderness. Patient will receive short course of antibiotics presumptively, with urine culture  pending.    Gerhard Munch, MD 09/24/15 [phone]

## 2015-11-23 ENCOUNTER — Ambulatory Visit: Payer: Medicaid Other | Admitting: Advanced Practice Midwife

## 2015-11-24 ENCOUNTER — Ambulatory Visit: Payer: Medicaid Other | Admitting: Advanced Practice Midwife

## 2018-07-07 ENCOUNTER — Emergency Department (HOSPITAL_COMMUNITY)
Admission: EM | Admit: 2018-07-07 | Discharge: 2018-07-07 | Discharge status: Against Medical Advice | Payer: BLUE CROSS/BLUE SHIELD

## 2018-07-07 ENCOUNTER — Inpatient Hospital Stay (HOSPITAL_COMMUNITY)
Admission: AD | Admit: 2018-07-07 | Discharge: 2018-07-07 | Discharge status: Against Medical Advice | Payer: BLUE CROSS/BLUE SHIELD | Source: Ambulatory Visit | Attending: Obstetrics & Gynecology | Admitting: Obstetrics & Gynecology

## 2018-07-07 NOTE — MAU Note (Signed)
Not in lobby x2.

## 2018-07-07 NOTE — MAU Note (Signed)
Not in lobby x3 assume pt left without being seen.

## 2018-07-07 NOTE — MAU Note (Signed)
Not in lobby x1  

## 2018-09-12 ENCOUNTER — Ambulatory Visit (INDEPENDENT_AMBULATORY_CARE_PROVIDER_SITE_OTHER): Payer: Self-pay | Admitting: Adult Health

## 2018-09-12 ENCOUNTER — Other Ambulatory Visit: Payer: Self-pay

## 2018-09-12 ENCOUNTER — Encounter: Payer: Self-pay | Admitting: Adult Health

## 2018-09-12 VITALS — BP 101/61 | HR 80 | Ht 67.0 in | Wt 109.0 lb

## 2018-09-12 DIAGNOSIS — O3680X0 Pregnancy with inconclusive fetal viability, not applicable or unspecified: Secondary | ICD-10-CM

## 2018-09-12 DIAGNOSIS — Z3A01 Less than 8 weeks gestation of pregnancy: Secondary | ICD-10-CM

## 2018-09-12 DIAGNOSIS — Z3201 Encounter for pregnancy test, result positive: Secondary | ICD-10-CM

## 2018-09-12 DIAGNOSIS — N926 Irregular menstruation, unspecified: Secondary | ICD-10-CM

## 2018-09-12 LAB — POCT URINE PREGNANCY: PREG TEST UR: POSITIVE — AB

## 2018-09-12 MED ORDER — PRENATAL PLUS 27-1 MG PO TABS: 1.0000 | ORAL_TABLET | Freq: Every day | ORAL | 12 refills | Status: DC

## 2018-09-12 NOTE — Progress Notes (Signed)
  Subjective:     Patient ID: Patricia Vaughn, female   DOB: 19-Nov-1991, 27 y.o.   MRN: [id number]  HPI Patricia Vaughn is a 27 year old Hisapnic female in for a UPT, she has missed a period and had +HPT.She has 26 yo and 26 yo, girls.    Review of Systems Patient denies any bleeding, cramping or nausea +missed period with +HPT  Reviewed past medical,surgical, social and family history. Reviewed medications and allergies.     Objective:   Physical Exam BP 101/61 (BP Location: Right Arm, Patient Position: Sitting, Cuff Size: Normal)   Pulse 80   Ht 5\' 7"  (1.702 m)   Wt 109 lb (49.4 kg)   LMP 08/04/2018   BMI 17.07 kg/m UPT +, about 5+4 weeks by LMP with EDD 05/11/19.Skin warm and dry. Neck: mid line trachea, normal thyroid, good ROM, no lymphadenopathy noted. Lungs: clear to ausculation bilaterally. Cardiovascular: regular rate and rhythm.Abdomen is soft and non tender. PHQ 2 score 0.    Assessment:     1. Positive pregnancy test   2. Less than [redacted] weeks gestation of pregnancy   3. Encounter to determine fetal viability of pregnancy, single or unspecified fetus       Plan:     Meds ordered this encounter  Medications  . prenatal vitamin w/FE, FA (PRENATAL 1 + 1) 27-1 MG TABS tablet    Sig: Take 1 tablet by mouth daily at 12 noon.    Dispense:  30 each    Refill:  12    Order Specific Question:   Supervising Provider    Answer:   Lazaro ArmsEURE, LUTHER H [[id number]]     [facility] Review handout on First trimester and by [facility]

## 2018-09-12 NOTE — Patient Instructions (Signed)
First Trimester of Pregnancy The first trimester of pregnancy is from week 1 until the end of week 13 (months 1 through 3). A week after a sperm fertilizes an egg, the egg will implant on the wall of the uterus. This embryo will begin to develop into a baby. Genes from you and your partner will form the baby. The female genes will determine whether the baby will be a boy or a girl. At 6-8 weeks, the eyes and face will be formed, and the heartbeat can be seen on ultrasound. At the end of 12 weeks, all the baby's organs will be formed. Now that you are pregnant, you will want to do everything you can to have a healthy baby. Two of the most important things are to get good prenatal care and to follow your health care provider's instructions. Prenatal care is all the medical care you receive before the baby's birth. This care will help prevent, find, and treat any problems during the pregnancy and childbirth. Body changes during your first trimester Your body goes through many changes during pregnancy. The changes vary from woman to woman.  You may gain or lose a couple of pounds at first.  You may feel sick to your stomach (nauseous) and you may throw up (vomit). If the vomiting is uncontrollable, call your health care provider.  You may tire easily.  You may develop headaches that can be relieved by medicines. All medicines should be approved by your health care provider.  You may urinate more often. Painful urination may mean you have a bladder infection.  You may develop heartburn as a result of your pregnancy.  You may develop constipation because certain hormones are causing the muscles that push stool through your intestines to slow down.  You may develop hemorrhoids or swollen veins (varicose veins).  Your breasts may begin to grow larger and become tender. Your nipples may stick out more, and the tissue that surrounds them (areola) may become darker.  Your gums may bleed and may be  sensitive to brushing and flossing.  Dark spots or blotches (chloasma, mask of pregnancy) may develop on your face. This will likely fade after the baby is born.  Your menstrual periods will stop.  You may have a loss of appetite.  You may develop cravings for certain kinds of food.  You may have changes in your emotions from day to day, such as being excited to be pregnant or being concerned that something may go wrong with the pregnancy and baby.  You may have more vivid and strange dreams.  You may have changes in your hair. These can include thickening of your hair, rapid growth, and changes in texture. Some women also have hair loss during or after pregnancy, or hair that feels dry or thin. Your hair will most likely return to normal after your baby is born.  What to expect at prenatal visits During a routine prenatal visit:  You will be weighed to make sure you and the baby are growing normally.  Your blood pressure will be taken.  Your abdomen will be measured to track your baby's growth.  The fetal heartbeat will be listened to between weeks 10 and 14 of your pregnancy.  Test results from any previous visits will be discussed.  Your health care provider may ask you:  How you are feeling.  If you are feeling the baby move.  If you have had any abnormal symptoms, such as leaking fluid, bleeding, severe headaches,   or abdominal cramping.  If you are using any tobacco products, including cigarettes, chewing tobacco, and electronic cigarettes.  If you have any questions.  Other tests that may be performed during your first trimester include:  Blood tests to find your blood type and to check for the presence of any previous infections. The tests will also be used to check for low iron levels (anemia) and protein on red blood cells (Rh antibodies). Depending on your risk factors, or if you previously had diabetes during pregnancy, you may have tests to check for high blood  sugar that affects pregnant women (gestational diabetes).  Urine tests to check for infections, diabetes, or protein in the urine.  An ultrasound to confirm the proper growth and development of the baby.  Fetal screens for spinal cord problems (spina bifida) and Down syndrome.  HIV (human immunodeficiency virus) testing. Routine prenatal testing includes screening for HIV, unless you choose not to have this test.  You may need other tests to make sure you and the baby are doing well.  Follow these instructions at home: Medicines  Follow your health care provider's instructions regarding medicine use. Specific medicines may be either safe or unsafe to take during pregnancy.  Take a prenatal vitamin that contains at least 600 micrograms (mcg) of folic acid.  If you develop constipation, try taking a stool softener if your health care provider approves. Eating and drinking  Eat a balanced diet that includes fresh fruits and vegetables, whole grains, good sources of protein such as meat, eggs, or tofu, and low-fat dairy. Your health care provider will help you determine the amount of weight gain that is right for you.  Avoid raw meat and uncooked cheese. These carry germs that can cause birth defects in the baby.  Eating four or five small meals rather than three large meals a day may help relieve nausea and vomiting. If you start to feel nauseous, eating a few soda crackers can be helpful. Drinking liquids between meals, instead of during meals, also seems to help ease nausea and vomiting.  Limit foods that are high in fat and processed sugars, such as fried and sweet foods.  To prevent constipation: ? Eat foods that are high in fiber, such as fresh fruits and vegetables, whole grains, and beans. ? Drink enough fluid to keep your urine clear or pale yellow. Activity  Exercise only as directed by your health care provider. Most women can continue their usual exercise routine during  pregnancy. Try to exercise for 30 minutes at least 5 days a week. Exercising will help you: ? Control your weight. ? Stay in shape. ? Be prepared for labor and delivery.  Experiencing pain or cramping in the lower abdomen or lower back is a good sign that you should stop exercising. Check with your health care provider before continuing with normal exercises.  Try to avoid standing for long periods of time. Move your legs often if you must stand in one place for a long time.  Avoid heavy lifting.  Wear low-heeled shoes and practice good posture.  You may continue to have sex unless your health care provider tells you not to. Relieving pain and discomfort  Wear a good support bra to relieve breast tenderness.  Take warm sitz baths to soothe any pain or discomfort caused by hemorrhoids. Use hemorrhoid cream if your health care provider approves.  Rest with your legs elevated if you have leg cramps or low back pain.  If you develop   varicose veins in your legs, wear support hose. Elevate your feet for 15 minutes, 3-4 times a day. Limit salt in your diet. Prenatal care  Schedule your prenatal visits by the twelfth week of pregnancy. They are usually scheduled monthly at first, then more often in the last 2 months before delivery.  Write down your questions. Take them to your prenatal visits.  Keep all your prenatal visits as told by your health care provider. This is important. Safety  Wear your seat belt at all times when driving.  Make a list of emergency phone numbers, including numbers for family, friends, the hospital, and police and fire departments. General instructions  Ask your health care provider for a referral to a local prenatal education class. Begin classes no later than the beginning of month 6 of your pregnancy.  Ask for help if you have counseling or nutritional needs during pregnancy. Your health care provider can offer advice or refer you to specialists for help  with various needs.  Do not use hot tubs, steam rooms, or saunas.  Do not douche or use tampons or scented sanitary pads.  Do not cross your legs for long periods of time.  Avoid cat litter boxes and soil used by cats. These carry germs that can cause birth defects in the baby and possibly loss of the fetus by miscarriage or stillbirth.  Avoid all smoking, herbs, alcohol, and medicines not prescribed by your health care provider. Chemicals in these products affect the formation and growth of the baby.  Do not use any products that contain nicotine or tobacco, such as cigarettes and e-cigarettes. If you need help quitting, ask your health care provider. You may receive counseling support and other resources to help you quit.  Schedule a dentist appointment. At home, brush your teeth with a soft toothbrush and be gentle when you floss. Contact a health care provider if:  You have dizziness.  You have mild pelvic cramps, pelvic pressure, or nagging pain in the abdominal area.  You have persistent nausea, vomiting, or diarrhea.  You have a bad smelling vaginal discharge.  You have pain when you urinate.  You notice increased swelling in your face, hands, legs, or ankles.  You are exposed to fifth disease or chickenpox.  You are exposed to German measles (rubella) and have never had it. Get help right away if:  You have a fever.  You are leaking fluid from your vagina.  You have spotting or bleeding from your vagina.  You have severe abdominal cramping or pain.  You have rapid weight gain or loss.  You vomit blood or material that looks like coffee grounds.  You develop a severe headache.  You have shortness of breath.  You have any kind of trauma, such as from a fall or a car accident. Summary  The first trimester of pregnancy is from week 1 until the end of week 13 (months 1 through 3).  Your body goes through many changes during pregnancy. The changes vary from  woman to woman.  You will have routine prenatal visits. During those visits, your health care provider will examine you, discuss any test results you may have, and talk with you about how you are feeling. This information is not intended to replace advice given to you by your health care provider. Make sure you discuss any questions you have with your health care provider. Document Released: 12/05/2001 Document Revised: 11/22/2016 Document Reviewed: 11/22/2016 Elsevier Interactive Patient Education  2018 [employer]  

## 2018-09-23 ENCOUNTER — Ambulatory Visit: Payer: Self-pay | Admitting: Adult Health

## 2018-09-24 ENCOUNTER — Ambulatory Visit (HOSPITAL_COMMUNITY): Admission: RE | Admit: 2018-09-24 | Payer: Self-pay | Source: Ambulatory Visit

## 2018-09-26 ENCOUNTER — Ambulatory Visit (HOSPITAL_COMMUNITY)
Admission: RE | Admit: 2018-09-26 | Discharge: 2018-09-26 | Disposition: A | Discharge status: Home / Self Care | Payer: Medicaid Other | Source: Ambulatory Visit | Attending: Adult Health | Admitting: Adult Health

## 2018-09-26 DIAGNOSIS — O3680X0 Pregnancy with inconclusive fetal viability, not applicable or unspecified: Secondary | ICD-10-CM | POA: Diagnosis not present

## 2018-09-26 DIAGNOSIS — Z3A08 8 weeks gestation of pregnancy: Secondary | ICD-10-CM | POA: Diagnosis not present

## 2018-09-26 DIAGNOSIS — O208 Other hemorrhage in early pregnancy: Secondary | ICD-10-CM | POA: Diagnosis not present

## 2018-10-10 ENCOUNTER — Encounter (HOSPITAL_COMMUNITY): Payer: Self-pay | Admitting: Emergency Medicine

## 2018-10-10 ENCOUNTER — Other Ambulatory Visit: Payer: Self-pay

## 2018-10-10 ENCOUNTER — Emergency Department (HOSPITAL_COMMUNITY)
Admission: EM | Admit: 2018-10-10 | Discharge: 2018-10-10 | Disposition: A | Discharge status: Home / Self Care | Payer: Medicaid Other | Attending: Emergency Medicine | Admitting: Emergency Medicine

## 2018-10-10 DIAGNOSIS — O208 Other hemorrhage in early pregnancy: Secondary | ICD-10-CM | POA: Diagnosis not present

## 2018-10-10 DIAGNOSIS — Z87891 Personal history of nicotine dependence: Secondary | ICD-10-CM | POA: Diagnosis not present

## 2018-10-10 DIAGNOSIS — O4691 Antepartum hemorrhage, unspecified, first trimester: Secondary | ICD-10-CM | POA: Insufficient documentation

## 2018-10-10 DIAGNOSIS — O23591 Infection of other part of genital tract in pregnancy, first trimester: Secondary | ICD-10-CM | POA: Diagnosis not present

## 2018-10-10 DIAGNOSIS — O4690 Antepartum hemorrhage, unspecified, unspecified trimester: Secondary | ICD-10-CM

## 2018-10-10 DIAGNOSIS — B9689 Other specified bacterial agents as the cause of diseases classified elsewhere: Secondary | ICD-10-CM | POA: Insufficient documentation

## 2018-10-10 DIAGNOSIS — Z3A10 10 weeks gestation of pregnancy: Secondary | ICD-10-CM | POA: Diagnosis not present

## 2018-10-10 DIAGNOSIS — N760 Acute vaginitis: Secondary | ICD-10-CM | POA: Insufficient documentation

## 2018-10-10 LAB — WET PREP, GENITAL
Sperm: NONE SEEN
TRICH WET PREP: NONE SEEN
Yeast Wet Prep HPF POC: NONE SEEN

## 2018-10-10 LAB — URINALYSIS, ROUTINE W REFLEX MICROSCOPIC
Bilirubin Urine: NEGATIVE
GLUCOSE, UA: NEGATIVE mg/dL
KETONES UR: NEGATIVE mg/dL
NITRITE: NEGATIVE
PROTEIN: NEGATIVE mg/dL
Specific Gravity, Urine: 1.016 (ref 1.005–1.030)
pH: 6 (ref 5.0–8.0)

## 2018-10-10 LAB — BASIC METABOLIC PANEL
ANION GAP: 8 (ref 5–15)
BUN: 9 mg/dL (ref 6–20)
CALCIUM: 9 mg/dL (ref 8.9–10.3)
CO2: 23 mmol/L (ref 22–32)
CREATININE: 0.5 mg/dL (ref 0.44–1.00)
Chloride: 104 mmol/L (ref 98–111)
GFR calc Af Amer: >60 mL/min (ref >60)
GLUCOSE: 96 mg/dL (ref 70–99)
Potassium: 3.9 mmol/L (ref 3.5–5.1)
Sodium: 135 mmol/L (ref 135–145)

## 2018-10-10 LAB — CBC WITH DIFFERENTIAL/PLATELET
Abs Immature Granulocytes: 0.03 10*3/uL (ref 0.00–0.07)
BASOS PCT: 0 %
Basophils Absolute: 0 10*3/uL (ref 0.0–0.1)
Eosinophils Absolute: 0.2 10*3/uL (ref 0.0–0.5)
Eosinophils Relative: 2 %
HEMATOCRIT: 39.3 % (ref 36.0–46.0)
Hemoglobin: 12.6 g/dL (ref 12.0–15.0)
IMMATURE GRANULOCYTES: 0 %
Lymphocytes Relative: 22 %
Lymphs Abs: 2.1 10*3/uL (ref 0.7–4.0)
MCH: 27.6 pg (ref 26.0–34.0)
MCHC: 32.1 g/dL (ref 30.0–36.0)
MCV: 86.2 fL (ref 80.0–100.0)
MONO ABS: 0.6 10*3/uL (ref 0.1–1.0)
Monocytes Relative: 7 %
NEUTROS ABS: 6.5 10*3/uL (ref 1.7–7.7)
Neutrophils Relative %: 69 %
Platelets: 117 10*3/uL — ABNORMAL LOW (ref 150–400)
RBC: 4.56 MIL/uL (ref 3.87–5.11)
RDW: 13.9 % (ref 11.5–15.5)
WBC Morphology: ABNORMAL
WBC: 9.4 10*3/uL (ref 4.0–10.5)
nRBC: 0 % (ref 0.0–0.2)

## 2018-10-10 LAB — ABO/RH: ABO/RH(D): B POS

## 2018-10-10 MED ORDER — METRONIDAZOLE 500 MG PO TABS: 500.0000 mg | ORAL_TABLET | Freq: Two times a day (BID) | ORAL | 0 refills | Status: AC

## 2018-10-10 NOTE — Discharge Instructions (Signed)
You were seen in the ED today with vaginal bleeding in pregnancy. You do have a bacterial infection on labs. Your baby looks to be doing well on ultrasound. Call your OB in the morning to discuss your symptoms and schedule a follow up appointment.

## 2018-10-10 NOTE — ED Triage Notes (Signed)
Pt states she is [redacted] weeks pregnant and has had vaginal bleeding today; pt states when it first started it was bright red but is now dark brown; pt c/o lower back pain

## 2018-10-10 NOTE — ED Notes (Signed)
ED Provider at bedside. 

## 2018-10-10 NOTE — ED Provider Notes (Signed)
Emergency Department Provider Note   I have reviewed the triage vital signs and the nursing notes.   HISTORY  Chief Complaint Vaginal Bleeding   HPI Patricia Vaughn is a 27 y.o. female G5P2 currently [redacted] weeks pregnant with US-confirmed IUP on 10/3 presents to the ED with vaginal spotting. Patient with spotting earlier today which has improved since arrival in the ED. No heavy bleeding or clot/tissue passage. No fever or chills. Patient has had some cramping lower abdominal pain. No vaginal discharge. No CP or SOB.   Past Medical History:  Diagnosis Date  . Medical history non-contributory   . Miscarriage, threatened, early pregnancy 03/28/2013  . Nausea 11/06/2013  . Supervision of low-risk pregnancy 11/06/2013    Patient Active Problem List   Diagnosis Date Noted  . Active labor at term 04/11/2014  . Supervision of other normal pregnancy 11/06/2013  . Nausea 11/06/2013    Past Surgical History:  Procedure Laterality Date  . NO PAST SURGERIES      Current Outpatient Rx  . Order #: [id number] Class: Print  . Order #: [id number] Class: Normal    Allergies Patient has no known allergies.  Family History  Problem Relation Age of Onset  . Hyperlipidemia Father   . Cancer Maternal Grandfather   . Diabetes Other   . Hypertension Other     Social History Social History   Tobacco Use  . Smoking status: Former Games developer  . Smokeless tobacco: Never Used  Substance Use Topics  . Alcohol use: No  . Drug use: No    Review of Systems  Constitutional: No fever/chills Eyes: No visual changes. ENT: No sore throat. Cardiovascular: Denies chest pain. Respiratory: Denies shortness of breath. Gastrointestinal: No abdominal pain.  No nausea, no vomiting.  No diarrhea.  No constipation. Genitourinary: Negative for dysuria. Positive vaginal spotting.  Musculoskeletal: Negative for back pain. Skin: Negative for rash. Neurological: Negative for headaches, focal weakness or  numbness.  10-point ROS otherwise negative.  ____________________________________________   PHYSICAL EXAM:  VITAL SIGNS: ED Triage Vitals  Enc Vitals Group     BP 10/10/18 1951 115/61     Pulse Rate 10/10/18 1951 80     Resp 10/10/18 1951 18     Temp 10/10/18 1951 98.2 F (36.8 C)     Temp Source 10/10/18 1951 Oral     SpO2 10/10/18 1951 100 %     Weight 10/10/18 1950 110 lb (49.9 kg)     Height 10/10/18 1950 5\' 7"  (1.702 m)     Pain Score 10/10/18 1952 5    Constitutional: Alert and oriented. Well appearing and in no acute distress. Eyes: Conjunctivae are normal.  Head: Atraumatic. Nose: No congestion/rhinnorhea. Mouth/Throat: Mucous membranes are moist.  Neck: No stridor.  Cardiovascular: Normal rate, regular rhythm. Good peripheral circulation. Grossly normal heart sounds.   Respiratory: Normal respiratory effort.  No retractions. Lungs CTAB. Gastrointestinal: Soft and nontender. No distention.  Genitourinary: Pelvic exam performed with nurse chaperone. Scant, pink discharge. Cervix is visually closed. No appreciable active bleeding.  Musculoskeletal: No lower extremity tenderness nor edema. No gross deformities of extremities. Neurologic:  Normal speech and language. No gross focal neurologic deficits are appreciated.  Skin:  Skin is warm, dry and intact. No rash noted.   ____________________________________________   LABS (all labs ordered are listed, but only abnormal results are displayed)  Labs Reviewed  WET PREP, GENITAL - Abnormal; Notable for the following components:      Result Value  Clue Cells Wet Prep HPF POC PRESENT (*)    WBC, Wet Prep HPF POC MANY (*)    All other components within normal limits  URINALYSIS, ROUTINE W REFLEX MICROSCOPIC - Abnormal; Notable for the following components:   APPearance HAZY (*)    Hgb urine dipstick LARGE (*)    Leukocytes, UA SMALL (*)    Bacteria, UA RARE (*)    All other components within normal limits  CBC  WITH DIFFERENTIAL/PLATELET - Abnormal; Notable for the following components:   Platelets 117 (*)    All other components within normal limits  BASIC METABOLIC PANEL  ABO/RH  GC/CHLAMYDIA PROBE AMP ([place]) NOT AT [facility]   ____________________________________________  RADIOLOGY  None ____________________________________________   PROCEDURES  Procedure(s) performed:   Procedures  EMERGENCY DEPARTMENT [place] PREGNANCY "Study: Limited Ultrasound of the Pelvis for Pregnancy"  INDICATIONS:Pregnancy(required) and Vaginal bleeding Multiple views of the uterus and pelvic cavity were obtained in real-time with a multi-frequency probe.  APPROACH:Transabdominal   PERFORMED BY: Myself  LIMITATIONS: none  PREGNANCY FINDINGS: Fetal pole present and Fetal heart activity seen  INTERPRETATION: Fetal pole present and Fetal heart activity seen   CPT Codes:  [phone] (transabdominal OB)  [phone] (transvaginal OB, Reduced level of service for incomplete exam)  ____________________________________________   INITIAL IMPRESSION / ASSESSMENT AND PLAN / ED COURSE  Pertinent labs & imaging results that were available during my care of the patient were reviewed by me and considered in my medical decision making (see chart for details).  Patient with vaginal bleeding in week 10 of pregnancy. IUP confirmed on 10/3 with TVUS. Confirmed here with bedside US showing IUP with positive heart activity. Cervix is visually closed. No active bleeding on pelvic exam here. Labs reviewed and normal with the exception of clue cells. Will treat BV in the 1st trimester and have the patient f/u with OB. Patient has small subchorionic hemorrhage on 10/3 which may also be contributing to symptoms.   At this time, I do not feel there is any life-threatening condition present. I have reviewed and discussed all results (EKG, imaging, lab, urine as appropriate), exam findings with patient. I have reviewed nursing  notes and appropriate previous records.  I feel the patient is safe to be discharged home without further emergent workup. Discussed usual and customary return precautions. Patient and family (if present) verbalize understanding and are comfortable with this plan.  Patient will follow-up with their primary care provider. If they do not have a primary care provider, information for follow-up has been provided to them. All questions have been answered.  ____________________________________________  FINAL CLINICAL IMPRESSION(S) / ED DIAGNOSES  Final diagnoses:  Vaginal bleeding in pregnancy  Bacterial vaginosis    NEW OUTPATIENT MEDICATIONS STARTED DURING THIS VISIT:  Discharge Medication List as of 10/10/2018 11:15 PM    START taking these medications   Details  metroNIDAZOLE (FLAGYL) 500 MG tablet Take 1 tablet (500 mg total) by mouth 2 (two) times daily for 7 days., Starting Thu 10/10/2018, Until Thu 10/17/2018, Print        Note:  This document was prepared using Dragon voice recognition software and may include unintentional dictation errors.  Alona Bene, MD Emergency Medicine    Long, Arlyss Repress, MD 10/11/18 [phone]

## 2018-10-14 ENCOUNTER — Encounter: Payer: Self-pay | Admitting: Women's Health

## 2018-10-14 ENCOUNTER — Ambulatory Visit: Payer: Medicaid Other | Admitting: *Deleted

## 2018-10-14 ENCOUNTER — Ambulatory Visit (INDEPENDENT_AMBULATORY_CARE_PROVIDER_SITE_OTHER): Payer: Medicaid Other | Admitting: Women's Health

## 2018-10-14 VITALS — BP 99/62 | HR 70 | Wt 111.0 lb

## 2018-10-14 DIAGNOSIS — Z3490 Encounter for supervision of normal pregnancy, unspecified, unspecified trimester: Secondary | ICD-10-CM | POA: Insufficient documentation

## 2018-10-14 DIAGNOSIS — Z3A11 11 weeks gestation of pregnancy: Secondary | ICD-10-CM

## 2018-10-14 DIAGNOSIS — Z3481 Encounter for supervision of other normal pregnancy, first trimester: Secondary | ICD-10-CM

## 2018-10-14 DIAGNOSIS — Z1389 Encounter for screening for other disorder: Secondary | ICD-10-CM

## 2018-10-14 DIAGNOSIS — Z331 Pregnant state, incidental: Secondary | ICD-10-CM

## 2018-10-14 LAB — POCT URINALYSIS DIPSTICK OB
Blood, UA: NEGATIVE
Glucose, UA: NEGATIVE
Ketones, UA: NEGATIVE
LEUKOCYTES UA: NEGATIVE
Nitrite, UA: NEGATIVE
PROTEIN: NEGATIVE

## 2018-10-14 LAB — GC/CHLAMYDIA PROBE AMP (~~LOC~~) NOT AT ARMC
Chlamydia: POSITIVE — AB
Neisseria Gonorrhea: NEGATIVE

## 2018-10-14 MED ORDER — DOXYLAMINE-PYRIDOXINE 10-10 MG PO TBEC: DELAYED_RELEASE_TABLET | ORAL | 6 refills | Status: DC

## 2018-10-14 NOTE — Patient Instructions (Signed)
Patricia Vaughn, I greatly value your feedback.  If you receive a survey following your visit with [place] today, we appreciate you taking the time to fill it out.  Thanks, Joellyn Haff, CNM, WHNP-BC   Nausea & Vomiting  Have saltine crackers or pretzels by your bed and eat a few bites before you raise your head out of bed in the morning  Eat small frequent meals throughout the day instead of large meals  Drink plenty of fluids throughout the day to stay hydrated, just don't drink a lot of fluids with your meals.  This can make your stomach fill up faster making you feel sick  Do not brush your teeth right after you eat  Products with real ginger are good for nausea, like ginger ale and ginger hard candy Make sure it says made with real ginger!  Sucking on sour candy like lemon heads is also good for nausea  If your prenatal vitamins make you nauseated, take them at night so you will sleep through the nausea  Sea Bands  If you feel like you need medicine for the nausea & vomiting please let us know  If you are unable to keep any fluids or food down please let us know   Constipation  Drink plenty of fluid, preferably water, throughout the day  Eat foods high in fiber such as fruits, vegetables, and grains  Exercise, such as walking, is a good way to keep your bowels regular  Drink warm fluids, especially warm prune juice, or decaf coffee  Eat a 1/2 cup of real oatmeal (not instant), 1/2 cup applesauce, and 1/2-1 cup warm prune juice every day  If needed, you may take Colace (docusate sodium) stool softener once or twice a day to help keep the stool soft. If you are pregnant, wait until you are out of your first trimester (12-14 weeks of pregnancy)  If you still are having problems with constipation, you may take Miralax once daily as needed to help keep your bowels regular.  If you are pregnant, wait until you are out of your first trimester (12-14 weeks of pregnancy)   First  Trimester of Pregnancy The first trimester of pregnancy is from week 1 until the end of week 12 (months 1 through 3). A week after a sperm fertilizes an egg, the egg will implant on the wall of the uterus. This embryo will begin to develop into a baby. Genes from you and your partner are forming the baby. The female genes determine whether the baby is a boy or a girl. At 6-8 weeks, the eyes and face are formed, and the heartbeat can be seen on ultrasound. At the end of 12 weeks, all the baby's organs are formed.  Now that you are pregnant, you will want to do everything you can to have a healthy baby. Two of the most important things are to get good prenatal care and to follow your health care provider's instructions. Prenatal care is all the medical care you receive before the baby's birth. This care will help prevent, find, and treat any problems during the pregnancy and childbirth. BODY CHANGES Your body goes through many changes during pregnancy. The changes vary from woman to woman.   You may gain or lose a couple of pounds at first.  You may feel sick to your stomach (nauseous) and throw up (vomit). If the vomiting is uncontrollable, call your health care provider.  You may tire easily.  You may develop headaches  that can be relieved by medicines approved by your health care provider.  You may urinate more often. Painful urination may mean you have a bladder infection.  You may develop heartburn as a result of your pregnancy.  You may develop constipation because certain hormones are causing the muscles that push waste through your intestines to slow down.  You may develop hemorrhoids or swollen, bulging veins (varicose veins).  Your breasts may begin to grow larger and become tender. Your nipples may stick out more, and the tissue that surrounds them (areola) may become darker.  Your gums may bleed and may be sensitive to brushing and flossing.  Dark spots or blotches (chloasma, mask  of pregnancy) may develop on your face. This will likely fade after the baby is born.  Your menstrual periods will stop.  You may have a loss of appetite.  You may develop cravings for certain kinds of food.  You may have changes in your emotions from day to day, such as being excited to be pregnant or being concerned that something may go wrong with the pregnancy and baby.  You may have more vivid and strange dreams.  You may have changes in your hair. These can include thickening of your hair, rapid growth, and changes in texture. Some women also have hair loss during or after pregnancy, or hair that feels dry or thin. Your hair will most likely return to normal after your baby is born. WHAT TO EXPECT AT YOUR PRENATAL VISITS During a routine prenatal visit:  You will be weighed to make sure you and the baby are growing normally.  Your blood pressure will be taken.  Your abdomen will be measured to track your baby's growth.  The fetal heartbeat will be listened to starting around week 10 or 12 of your pregnancy.  Test results from any previous visits will be discussed. Your health care provider may ask you:  How you are feeling.  If you are feeling the baby move.  If you have had any abnormal symptoms, such as leaking fluid, bleeding, severe headaches, or abdominal cramping.  If you have any questions. Other tests that may be performed during your first trimester include:  Blood tests to find your blood type and to check for the presence of any previous infections. They will also be used to check for low iron levels (anemia) and Rh antibodies. Later in the pregnancy, blood tests for diabetes will be done along with other tests if problems develop.  Urine tests to check for infections, diabetes, or protein in the urine.  An ultrasound to confirm the proper growth and development of the baby.  An amniocentesis to check for possible genetic problems.  Fetal screens for spina  bifida and Down syndrome.  You may need other tests to make sure you and the baby are doing well. HOME CARE INSTRUCTIONS  Medicines  Follow your health care provider's instructions regarding medicine use. Specific medicines may be either safe or unsafe to take during pregnancy.  Take your prenatal vitamins as directed.  If you develop constipation, try taking a stool softener if your health care provider approves. Diet  Eat regular, well-balanced meals. Choose a variety of foods, such as meat or vegetable-based protein, fish, milk and low-fat dairy products, vegetables, fruits, and whole grain breads and cereals. Your health care provider will help you determine the amount of weight gain that is right for you.  Avoid raw meat and uncooked cheese. These carry germs that can  cause birth defects in the baby.  Eating four or five small meals rather than three large meals a day may help relieve nausea and vomiting. If you start to feel nauseous, eating a few soda crackers can be helpful. Drinking liquids between meals instead of during meals also seems to help nausea and vomiting.  If you develop constipation, eat more high-fiber foods, such as fresh vegetables or fruit and whole grains. Drink enough fluids to keep your urine clear or pale yellow. Activity and Exercise  Exercise only as directed by your health care provider. Exercising will help you:  Control your weight.  Stay in shape.  Be prepared for labor and delivery.  Experiencing pain or cramping in the lower abdomen or low back is a good sign that you should stop exercising. Check with your health care provider before continuing normal exercises.  Try to avoid standing for long periods of time. Move your legs often if you must stand in one place for a long time.  Avoid heavy lifting.  Wear low-heeled shoes, and practice good posture.  You may continue to have sex unless your health care provider directs you  otherwise. Relief of Pain or Discomfort  Wear a good support bra for breast tenderness.   Take warm sitz baths to soothe any pain or discomfort caused by hemorrhoids. Use hemorrhoid cream if your health care provider approves.   Rest with your legs elevated if you have leg cramps or low back pain.  If you develop varicose veins in your legs, wear support hose. Elevate your feet for 15 minutes, 3-4 times a day. Limit salt in your diet. Prenatal Care  Schedule your prenatal visits by the twelfth week of pregnancy. They are usually scheduled monthly at first, then more often in the last 2 months before delivery.  Write down your questions. Take them to your prenatal visits.  Keep all your prenatal visits as directed by your health care provider. Safety  Wear your seat belt at all times when driving.  Make a list of emergency phone numbers, including numbers for family, friends, the hospital, and police and fire departments. General Tips  Ask your health care provider for a referral to a local prenatal education class. Begin classes no later than at the beginning of month 6 of your pregnancy.  Ask for help if you have counseling or nutritional needs during pregnancy. Your health care provider can offer advice or refer you to specialists for help with various needs.  Do not use hot tubs, steam rooms, or saunas.  Do not douche or use tampons or scented sanitary pads.  Do not cross your legs for long periods of time.  Avoid cat litter boxes and soil used by cats. These carry germs that can cause birth defects in the baby and possibly loss of the fetus by miscarriage or stillbirth.  Avoid all smoking, herbs, alcohol, and medicines not prescribed by your health care provider. Chemicals in these affect the formation and growth of the baby.  Schedule a dentist appointment. At home, brush your teeth with a soft toothbrush and be gentle when you floss. SEEK MEDICAL CARE IF:   You have  dizziness.  You have mild pelvic cramps, pelvic pressure, or nagging pain in the abdominal area.  You have persistent nausea, vomiting, or diarrhea.  You have a bad smelling vaginal discharge.  You have pain with urination.  You notice increased swelling in your face, hands, legs, or ankles. SEEK IMMEDIATE MEDICAL CARE IF:  You have a fever.  You are leaking fluid from your vagina.  You have spotting or bleeding from your vagina.  You have severe abdominal cramping or pain.  You have rapid weight gain or loss.  You vomit blood or material that looks like coffee grounds.  You are exposed to [place] measles and have never had them.  You are exposed to fifth disease or chickenpox.  You develop a severe headache.  You have shortness of breath.  You have any kind of trauma, such as from a fall or a car accident. Document Released: 12/05/2001 Document Revised: 04/27/2014 Document Reviewed: 10/21/2013 [facility] Patient Information 2015 [place], [place]. This information is not intended to replace advice given to you by your health care provider. Make sure you discuss any questions you have with your health care provider.

## 2018-10-14 NOTE — Progress Notes (Signed)
INITIAL OBSTETRICAL VISIT Patient name: Patricia Vaughn MRN [id number]  Date of birth: [birth year] Chief Complaint:   Initial Prenatal Visit (nausea, BV)  History of Present Illness:   Patricia Vaughn is a 27 y.o. [id number] Hispanic female at [redacted]w[redacted]d by 8wk u/s, with an Estimated Date of Delivery: 05/03/19 being seen today for her initial obstetrical visit.   Her obstetrical history is significant for term uncomplicated SVB x 2, SAB x 2.   Today she reports n/v- requests meds.  Patient's last menstrual period was 08/04/2018. Last pap 11/06/13. Results were: normal Review of Systems:   Pertinent items are noted in HPI Denies cramping/contractions, leakage of fluid, vaginal bleeding, abnormal vaginal discharge w/ itching/odor/irritation, headaches, visual changes, shortness of breath, chest pain, abdominal pain, severe nausea/vomiting, or problems with urination or bowel movements unless otherwise stated above.  Pertinent History Reviewed:  Reviewed past medical,surgical, social, obstetrical and family history.  Reviewed problem list, medications and allergies. OB History  Gravida Para Term Preterm AB Living  5 2 2  0 2 2  SAB TAB Ectopic Multiple Live Births  2 0 0 0 2    # Outcome Date GA Lbr Len/2nd Weight Sex Delivery Anes PTL Lv  5 Current           4 Term 04/11/14 [redacted]w[redacted]d 12:18 / 00:05 7 lb 11.6 oz (3.505 kg) F Vag-Spont None N LIV  3 SAB 2014             Birth Comments: System Generated. Please review and update pregnancy details.  2 Term 12/02/10 [redacted]w[redacted]d  7 lb 6 oz (3.345 kg) F Vag-Spont None N LIV  1 SAB 2011           Physical Assessment:   Vitals:   10/14/18 1000  BP: 99/62  Pulse: 70  Weight: 111 lb (50.3 kg)  Body mass index is 17.39 kg/m.       Physical Examination:  General appearance - well appearing, and in no distress  Mental status - alert, oriented to person, place, and time  Psych:  She has a normal mood and affect  Skin - warm and dry, normal color, no suspicious  lesions noted  Chest - effort normal, all lung fields clear to auscultation bilaterally  Heart - normal rate and regular rhythm  Abdomen - soft, nontender  Extremities:  No swelling or varicosities noted  Pelvic - VULVA: normal appearing vulva with no masses, tenderness or lesions  VAGINA: normal appearing vagina with normal color and discharge, no lesions  CERVIX: normal appearing cervix without discharge or lesions, no CMT  Thin prep pap is not done>wants to do next visit  Fetal Heart Rate (bpm): +u/s via informal transabdominal, u/s, +active fetus  Results for orders placed or performed in visit on 10/14/18 (from the past 24 hour(s))  POC Urinalysis Dipstick OB   Collection Time: 10/14/18 10:19 AM  Result Value Ref Range   Color, UA     Clarity, UA     Glucose, UA Negative Negative   Bilirubin, UA     Ketones, UA neg    Spec Grav, UA     Blood, UA neg    pH, UA     POC Protein UA Negative Negative, Trace   Urobilinogen, UA     Nitrite, UA neg    Leukocytes, UA Negative Negative   Appearance     Odor      Assessment & Plan:  1) Low-Risk Pregnancy [id number] at [redacted]w[redacted]d  with an Estimated Date of Delivery: 05/03/19   2) Initial OB visit  3) N/V> rx diclegis  Meds:  Meds ordered this encounter  Medications  . Doxylamine-Pyridoxine (DICLEGIS) 10-10 MG TBEC    Sig: 2 tabs q hs, if sx persist add 1 tab q am on day 3, if sx persist add 1 tab q afternoon on day 4    Dispense:  100 tablet    Refill:  6    Order Specific Question:   Supervising Provider    Answer:   Lazaro Arms [[id number]]    Initial labs obtained Continue prenatal vitamins Reviewed n/v relief measures and warning s/s to report Reviewed recommended weight gain based on pre-gravid BMI Encouraged well-balanced diet Genetic Screening discussed Integrated Screen: declined Cystic fibrosis screening discussed declined Ultrasound discussed; fetal survey: requested CCNC completed>PCM not here Declined flu shot. She was  advised that the flu shot is recommended during pregnancy to help protect her and her baby. We discussed that it is an inactivated vaccine-so side effects are minimal, it is considered safe to receive during any trimester, and pregnant women are at a higher risk of developing potential complications from the flu, including death. She was given printed information from the CDC regarding the flu shot and the flu.    Follow-up: Return in about 4 weeks (around 11/11/2018) for LROB w/ pap.   Orders Placed This Encounter  Procedures  . Urine Culture  . HIV Antibody (routine testing w rflx)  . Hepatitis B Surface AntiGEN  . RPR  . Rubella screen  . Pain Management Screening Profile (10S)  . POC Urinalysis Dipstick OB  . Antibody screen    Cheral Marker CNM, [facility] 10/14/2018 10:44 AM

## 2018-10-15 LAB — HEPATITIS B SURFACE ANTIGEN: Hepatitis B Surface Ag: NEGATIVE

## 2018-10-15 LAB — PMP SCREEN PROFILE (10S), URINE
Amphetamine Scrn, Ur: NEGATIVE ng/mL
BARBITURATE SCREEN URINE: NEGATIVE ng/mL
BENZODIAZEPINE SCREEN, URINE: NEGATIVE ng/mL
CANNABINOIDS UR QL SCN: NEGATIVE ng/mL
CREATININE(CRT), U: 184 mg/dL (ref 20.0–300.0)
Cocaine (Metab) Scrn, Ur: NEGATIVE ng/mL
METHADONE SCREEN, URINE: NEGATIVE ng/mL
OPIATE SCREEN URINE: NEGATIVE ng/mL
OXYCODONE+OXYMORPHONE UR QL SCN: NEGATIVE ng/mL
PH UR, DRUG SCRN: 6.4 (ref 4.5–8.9)
Phencyclidine Qn, Ur: NEGATIVE ng/mL
Propoxyphene Scrn, Ur: NEGATIVE ng/mL

## 2018-10-15 LAB — HIV ANTIBODY (ROUTINE TESTING W REFLEX): HIV SCREEN 4TH GENERATION: NONREACTIVE

## 2018-10-15 LAB — RUBELLA SCREEN: RUBELLA: 1.46 {index} (ref >0.99)

## 2018-10-15 LAB — RPR: RPR Ser Ql: NONREACTIVE

## 2018-10-15 LAB — ANTIBODY SCREEN: Antibody Screen: NEGATIVE

## 2018-10-16 LAB — URINE CULTURE: Organism ID, Bacteria: NO GROWTH

## 2018-10-18 ENCOUNTER — Encounter: Payer: Self-pay | Admitting: Women's Health

## 2018-10-18 ENCOUNTER — Other Ambulatory Visit: Payer: Self-pay | Admitting: Women's Health

## 2018-10-18 DIAGNOSIS — O98819 Other maternal infectious and parasitic diseases complicating pregnancy, unspecified trimester: Secondary | ICD-10-CM

## 2018-10-18 DIAGNOSIS — A749 Chlamydial infection, unspecified: Secondary | ICD-10-CM

## 2018-10-18 MED ORDER — AZITHROMYCIN 500 MG PO TABS: 1000.0000 mg | ORAL_TABLET | Freq: Once | ORAL | 0 refills | Status: AC

## 2018-10-21 ENCOUNTER — Telehealth: Payer: Self-pay | Admitting: *Deleted

## 2018-10-21 NOTE — Telephone Encounter (Signed)
LMOVM requesting patient return my call regarding labs.

## 2018-10-21 NOTE — Telephone Encounter (Signed)
LMOVM

## 2018-10-22 ENCOUNTER — Telehealth: Payer: Self-pay | Admitting: *Deleted

## 2018-10-22 NOTE — Telephone Encounter (Signed)
Pt notified of +CT by Maryan Char, RN. Pt's rx sent 10/18/18. I called in rx for azithromycin 1gm po x 1 w/ 0RF to [facility] [place] for partner Ileana Roup DOB 08/27/1992 NKDA. No sex x at least 7d from both finishing meds. POC for pt in 3-4wks Cheral Marker, CNM, [facility] 10/22/2018 2:24 PM

## 2018-10-22 NOTE — Telephone Encounter (Signed)
Wants partner treated.  Patricia Vaughn  DOB 08/27/1991 NKDA [facility]-

## 2018-10-22 NOTE — Telephone Encounter (Signed)
LMOVM.    Pt returned called.  Informed she has tested positive for chlamydia.  Antibiotic has been sent to pharmacy and partner needs treatment as well.  Advised no sex for 7 days from treatment and will recheck at next visit.  States she will talk with partner and call back if he wants [place] to treat.

## 2018-11-11 ENCOUNTER — Other Ambulatory Visit (HOSPITAL_COMMUNITY)
Admission: RE | Admit: 2018-11-11 | Discharge: 2018-11-11 | Disposition: A | Discharge status: Home / Self Care | Payer: Medicaid Other | Source: Ambulatory Visit | Attending: Obstetrics & Gynecology | Admitting: Obstetrics & Gynecology

## 2018-11-11 ENCOUNTER — Encounter: Payer: Self-pay | Admitting: Women's Health

## 2018-11-11 ENCOUNTER — Ambulatory Visit (INDEPENDENT_AMBULATORY_CARE_PROVIDER_SITE_OTHER): Payer: Medicaid Other | Admitting: Women's Health

## 2018-11-11 ENCOUNTER — Other Ambulatory Visit: Payer: Self-pay

## 2018-11-11 VITALS — BP 99/58 | HR 79 | Wt 117.0 lb

## 2018-11-11 DIAGNOSIS — N898 Other specified noninflammatory disorders of vagina: Secondary | ICD-10-CM | POA: Diagnosis not present

## 2018-11-11 DIAGNOSIS — Z3482 Encounter for supervision of other normal pregnancy, second trimester: Secondary | ICD-10-CM | POA: Diagnosis not present

## 2018-11-11 DIAGNOSIS — O26892 Other specified pregnancy related conditions, second trimester: Secondary | ICD-10-CM

## 2018-11-11 DIAGNOSIS — Z331 Pregnant state, incidental: Secondary | ICD-10-CM

## 2018-11-11 DIAGNOSIS — Z23 Encounter for immunization: Secondary | ICD-10-CM

## 2018-11-11 DIAGNOSIS — Z124 Encounter for screening for malignant neoplasm of cervix: Secondary | ICD-10-CM | POA: Insufficient documentation

## 2018-11-11 DIAGNOSIS — Z3A15 15 weeks gestation of pregnancy: Secondary | ICD-10-CM | POA: Insufficient documentation

## 2018-11-11 DIAGNOSIS — Z363 Encounter for antenatal screening for malformations: Secondary | ICD-10-CM

## 2018-11-11 DIAGNOSIS — Z1389 Encounter for screening for other disorder: Secondary | ICD-10-CM

## 2018-11-11 LAB — POCT WET PREP (WET MOUNT)
Clue Cells Wet Prep Whiff POC: POSITIVE
TRICHOMONAS WET PREP HPF POC: ABSENT

## 2018-11-11 LAB — POCT URINALYSIS DIPSTICK OB
GLUCOSE, UA: NEGATIVE
Ketones, UA: NEGATIVE
LEUKOCYTES UA: NEGATIVE
NITRITE UA: NEGATIVE
PROTEIN: NEGATIVE
RBC UA: NEGATIVE

## 2018-11-11 MED ORDER — METRONIDAZOLE 500 MG PO TABS: 500.0000 mg | ORAL_TABLET | Freq: Two times a day (BID) | ORAL | 0 refills | Status: DC

## 2018-11-11 NOTE — Patient Instructions (Signed)
Patricia Vaughn, I greatly value your feedback.  If you receive a survey following your visit with us today, we appreciate you taking the time to fill it out.  Thanks, Joellyn HaffKim Arlys Scatena, CNM, WHNP-BC   Second Trimester of Pregnancy The second trimester is from week 14 through week 27 (months 4 through 6). The second trimester is often a time when you feel your best. Your body has adjusted to being pregnant, and you begin to feel better physically. Usually, morning sickness has lessened or quit completely, you may have more energy, and you may have an increase in appetite. The second trimester is also a time when the fetus is growing rapidly. At the end of the sixth month, the fetus is about 9 inches long and weighs about 1 pounds. You will likely begin to feel the baby move (quickening) between 16 and 20 weeks of pregnancy. Body changes during your second trimester Your body continues to go through many changes during your second trimester. The changes vary from woman to woman.  Your weight will continue to increase. You will notice your lower abdomen bulging out.  You may begin to get stretch marks on your hips, abdomen, and breasts.  You may develop headaches that can be relieved by medicines. The medicines should be approved by your health care provider.  You may urinate more often because the fetus is pressing on your bladder.  You may develop or continue to have heartburn as a result of your pregnancy.  You may develop constipation because certain hormones are causing the muscles that push waste through your intestines to slow down.  You may develop hemorrhoids or swollen, bulging veins (varicose veins).  You may have back pain. This is caused by: ? Weight gain. ? Pregnancy hormones that are relaxing the joints in your pelvis. ? A shift in weight and the muscles that support your balance.  Your breasts will continue to grow and they will continue to become tender.  Your gums may bleed and  may be sensitive to brushing and flossing.  Dark spots or blotches (chloasma, mask of pregnancy) may develop on your face. This will likely fade after the baby is born.  A dark line from your belly button to the pubic area (linea nigra) may appear. This will likely fade after the baby is born.  You may have changes in your hair. These can include thickening of your hair, rapid growth, and changes in texture. Some women also have hair loss during or after pregnancy, or hair that feels dry or thin. Your hair will most likely return to normal after your baby is born.  What to expect at prenatal visits During a routine prenatal visit:  You will be weighed to make sure you and the fetus are growing normally.  Your blood pressure will be taken.  Your abdomen will be measured to track your baby's growth.  The fetal heartbeat will be listened to.  Any test results from the previous visit will be discussed.  Your health care provider may ask you:  How you are feeling.  If you are feeling the baby move.  If you have had any abnormal symptoms, such as leaking fluid, bleeding, severe headaches, or abdominal cramping.  If you are using any tobacco products, including cigarettes, chewing tobacco, and electronic cigarettes.  If you have any questions.  Other tests that may be performed during your second trimester include:  Blood tests that check for: ? Low iron levels (anemia). ? High  blood sugar that affects pregnant women (gestational diabetes) between 3 and 28 weeks. ? Rh antibodies. This is to check for a protein on red blood cells (Rh factor).  Urine tests to check for infections, diabetes, or protein in the urine.  An ultrasound to confirm the proper growth and development of the baby.  An amniocentesis to check for possible genetic problems.  Fetal screens for spina bifida and Down syndrome.  HIV (human immunodeficiency virus) testing. Routine prenatal testing includes  screening for HIV, unless you choose not to have this test.  Follow these instructions at home: Medicines  Follow your health care provider's instructions regarding medicine use. Specific medicines may be either safe or unsafe to take during pregnancy.  Take a prenatal vitamin that contains at least 600 micrograms (mcg) of folic acid.  If you develop constipation, try taking a stool softener if your health care provider approves. Eating and drinking  Eat a balanced diet that includes fresh fruits and vegetables, whole grains, good sources of protein such as meat, eggs, or tofu, and low-fat dairy. Your health care provider will help you determine the amount of weight gain that is right for you.  Avoid raw meat and uncooked cheese. These carry germs that can cause birth defects in the baby.  If you have low calcium intake from food, talk to your health care provider about whether you should take a daily calcium supplement.  Limit foods that are high in fat and processed sugars, such as fried and sweet foods.  To prevent constipation: ? Drink enough fluid to keep your urine clear or pale yellow. ? Eat foods that are high in fiber, such as fresh fruits and vegetables, whole grains, and beans. Activity  Exercise only as directed by your health care provider. Most women can continue their usual exercise routine during pregnancy. Try to exercise for 30 minutes at least 5 days a week. Stop exercising if you experience uterine contractions.  Avoid heavy lifting, wear low heel shoes, and practice good posture.  A sexual relationship may be continued unless your health care provider directs you otherwise. Relieving pain and discomfort  Wear a good support bra to prevent discomfort from breast tenderness.  Take warm sitz baths to soothe any pain or discomfort caused by hemorrhoids. Use hemorrhoid cream if your health care provider approves.  Rest with your legs elevated if you have leg cramps  or low back pain.  If you develop varicose veins, wear support hose. Elevate your feet for 15 minutes, 3-4 times a day. Limit salt in your diet. Prenatal Care  Write down your questions. Take them to your prenatal visits.  Keep all your prenatal visits as told by your health care provider. This is important. Safety  Wear your seat belt at all times when driving.  Make a list of emergency phone numbers, including numbers for family, friends, the hospital, and police and fire departments. General instructions  Ask your health care provider for a referral to a local prenatal education class. Begin classes no later than the beginning of month 6 of your pregnancy.  Ask for help if you have counseling or nutritional needs during pregnancy. Your health care provider can offer advice or refer you to specialists for help with various needs.  Do not use hot tubs, steam rooms, or saunas.  Do not douche or use tampons or scented sanitary pads.  Do not cross your legs for long periods of time.  Avoid cat litter boxes and soil  used by cats. These carry germs that can cause birth defects in the baby and possibly loss of the fetus by miscarriage or stillbirth.  Avoid all smoking, herbs, alcohol, and unprescribed drugs. Chemicals in these products can affect the formation and growth of the baby.  Do not use any products that contain nicotine or tobacco, such as cigarettes and e-cigarettes. If you need help quitting, ask your health care provider.  Visit your dentist if you have not gone yet during your pregnancy. Use a soft toothbrush to brush your teeth and be gentle when you floss. Contact a health care provider if:  You have dizziness.  You have mild pelvic cramps, pelvic pressure, or nagging pain in the abdominal area.  You have persistent nausea, vomiting, or diarrhea.  You have a bad smelling vaginal discharge.  You have pain when you urinate. Get help right away if:  You have a  fever.  You are leaking fluid from your vagina.  You have spotting or bleeding from your vagina.  You have severe abdominal cramping or pain.  You have rapid weight gain or weight loss.  You have shortness of breath with chest pain.  You notice sudden or extreme swelling of your face, hands, ankles, feet, or legs.  You have not felt your baby move in over an hour.  You have severe headaches that do not go away when you take medicine.  You have vision changes. Summary  The second trimester is from week 14 through week 27 (months 4 through 6). It is also a time when the fetus is growing rapidly.  Your body goes through many changes during pregnancy. The changes vary from woman to woman.  Avoid all smoking, herbs, alcohol, and unprescribed drugs. These chemicals affect the formation and growth your baby.  Do not use any tobacco products, such as cigarettes, chewing tobacco, and e-cigarettes. If you need help quitting, ask your health care provider.  Contact your health care provider if you have any questions. Keep all prenatal visits as told by your health care provider. This is important. This information is not intended to replace advice given to you by your health care provider. Make sure you discuss any questions you have with your health care provider. Document Released: 12/05/2001 Document Revised: 05/18/2016 Document Reviewed: 02/11/2013 Elsevier Interactive Patient Education  2017 [employer].

## 2018-11-11 NOTE — Progress Notes (Signed)
LOW-RISK PREGNANCY VISIT Patient name: Patricia Vaughn MRN [id number]  Date of birth: 1991/05/20 Chief Complaint:   Routine Prenatal Visit (c/o some itching and odor with discharge)  History of Present Illness:   Patricia Vaughn is a 27 y.o. [id number] female at [redacted]w[redacted]d with an Estimated Date of Delivery: 05/03/19 being seen today for ongoing management of a low-risk pregnancy.  Today she reports vaginal d/c, itching/odor. Took azithromycin for +CT, however vomited app 1.5hrs later. Partner took his meds. Haven't had sex since.  . Vag. Bleeding: None.  Movement: Absent. denies leaking of fluid. Review of Systems:   Pertinent items are noted in HPI Denies abnormal vaginal discharge w/ itching/odor/irritation, headaches, visual changes, shortness of breath, chest pain, abdominal pain, severe nausea/vomiting, or problems with urination or bowel movements unless otherwise stated above. Pertinent History Reviewed:  Reviewed past medical,surgical, social, obstetrical and family history.  Reviewed problem list, medications and allergies. Physical Assessment:   Vitals:   11/11/18 1018  BP: (!) 99/58  Pulse: 79  Weight: 117 lb (53.1 kg)  Body mass index is 18.32 kg/m.        Physical Examination:   General appearance: Well appearing, and in no distress  Mental status: Alert, oriented to person, place, and time  Skin: Warm & dry  Cardiovascular: Normal heart rate noted  Respiratory: Normal respiratory effort, no distress  Abdomen: Soft, gravid, nontender  Pelvic: pap obtained, wet prep obtained, mod amt thin white malodorous d/c          Extremities: Edema: None  Fetal Status: Fetal Heart Rate (bpm): 150   Movement: Absent    Results for orders placed or performed in visit on 11/11/18 (from the past 24 hour(s))  POC Urinalysis Dipstick OB   Collection Time: 11/11/18 10:18 AM  Result Value Ref Range   Color, UA     Clarity, UA     Glucose, UA Negative Negative   Bilirubin, UA     Ketones, UA  neg    Spec Grav, UA     Blood, UA neg    pH, UA     POC,PROTEIN,UA Negative Negative, Trace, Small (1+), Moderate (2+), Large (3+), 4+   Urobilinogen, UA     Nitrite, UA neg    Leukocytes, UA Negative Negative   Appearance     Odor    POCT Wet Prep [facility])   Collection Time: 11/11/18 11:11 AM  Result Value Ref Range   Source Wet Prep POC vaginal    WBC, Wet Prep HPF POC few    Bacteria Wet Prep HPF POC Few Few   BACTERIA WET PREP MORPHOLOGY POC     Clue Cells Wet Prep HPF POC Many (A) None   Clue Cells Wet Prep Whiff POC Positive Whiff    Yeast Wet Prep HPF POC None None   KOH Wet Prep POC     Trichomonas Wet Prep HPF POC Absent Absent    Assessment & Plan:  1) Low-risk pregnancy [id number] at [redacted]w[redacted]d with an Estimated Date of Delivery: 05/03/19   2) Recent +CT, vomited azithromycin 1.5hrs later, pap w/ gc/ct today   Meds:  Meds ordered this encounter  Medications  . metroNIDAZOLE (FLAGYL) 500 MG tablet    Sig: Take 1 tablet (500 mg total) by mouth 2 (two) times daily.    Dispense:  14 tablet    Refill:  0    Order Specific Question:   Supervising Provider    Answer:  EURE, LUTHER H [[id number]]   Labs/procedures today: wet prep, pap, gc/ct, flu shot  Plan:  Continue routine obstetrical care   Reviewed: Preterm labor symptoms and general obstetric precautions including but not limited to vaginal bleeding, contractions, leaking of fluid and fetal movement were reviewed in detail with the patient.  All questions were answered  Follow-up: Return in about 3 weeks (around 12/02/2018) for LROB, UJ:[id number]:Anatomy.  Orders Placed This Encounter  Procedures  . US OB Comp + 14 Wk  . Flu Vaccine QUAD 36+ mos IM (Fluarix, Quad PF)  . POC Urinalysis Dipstick OB  . POCT Wet Prep [facility] [place])   Cheral MarkerKimberly R Dominque Marlin CNM, [facility]NP[facility] 11/11/2018 11:11 AM

## 2018-11-13 LAB — CYTOLOGY - PAP
Chlamydia: NEGATIVE
DIAGNOSIS: NEGATIVE
NEISSERIA GONORRHEA: NEGATIVE

## 2018-12-06 ENCOUNTER — Encounter: Payer: Self-pay | Admitting: Obstetrics & Gynecology

## 2018-12-06 ENCOUNTER — Other Ambulatory Visit: Payer: Self-pay

## 2018-12-06 ENCOUNTER — Ambulatory Visit (INDEPENDENT_AMBULATORY_CARE_PROVIDER_SITE_OTHER): Payer: Medicaid Other | Admitting: Obstetrics & Gynecology

## 2018-12-06 ENCOUNTER — Ambulatory Visit (INDEPENDENT_AMBULATORY_CARE_PROVIDER_SITE_OTHER): Payer: Medicaid Other

## 2018-12-06 VITALS — BP 101/57 | HR 84 | Wt 119.0 lb

## 2018-12-06 DIAGNOSIS — Z363 Encounter for antenatal screening for malformations: Secondary | ICD-10-CM

## 2018-12-06 DIAGNOSIS — Z3482 Encounter for supervision of other normal pregnancy, second trimester: Secondary | ICD-10-CM

## 2018-12-06 DIAGNOSIS — Z3A18 18 weeks gestation of pregnancy: Secondary | ICD-10-CM

## 2018-12-06 DIAGNOSIS — Z331 Pregnant state, incidental: Secondary | ICD-10-CM

## 2018-12-06 DIAGNOSIS — Z1389 Encounter for screening for other disorder: Secondary | ICD-10-CM

## 2018-12-06 LAB — POCT URINALYSIS DIPSTICK OB
Blood, UA: NEGATIVE
Glucose, UA: NEGATIVE
Ketones, UA: NEGATIVE
LEUKOCYTES UA: NEGATIVE
NITRITE UA: NEGATIVE
PROTEIN: NEGATIVE

## 2018-12-06 NOTE — Progress Notes (Signed)
US 18+6 wks,cephalic,cx 4.1 cm,left lateral placenta gr 0,right ovary not visualized,normal left ovary,svp of fluid 4.2 cm,fhr 152 bpm,simple left corpus luteal cyst 3.6 x 2.9 x 3.3 mm,efw 287 g 72%,anatomy complete

## 2018-12-06 NOTE — Progress Notes (Signed)
   LOW-RISK PREGNANCY VISIT Patient name: Patricia Vaughn MRN [id number]  Date of birth: Apr 10, 1991 Chief Complaint:   Routine Prenatal Visit (u/s today)  History of Present Illness:   Patricia Vaughn is a 27 y.o. [id number] female at 10465w6d with an Estimated Date of Delivery: 05/03/19 being seen today for ongoing management of a low-risk pregnancy.  Today she reports no complaints.  . Vag. Bleeding: None.  Movement: Present. denies leaking of fluid. Review of Systems:   Pertinent items are noted in HPI Denies abnormal vaginal discharge w/ itching/odor/irritation, headaches, visual changes, shortness of breath, chest pain, abdominal pain, severe nausea/vomiting, or problems with urination or bowel movements unless otherwise stated above. Pertinent History Reviewed:  Reviewed past medical,surgical, social, obstetrical and family history.  Reviewed problem list, medications and allergies. Physical Assessment:   Vitals:   12/06/18 1001  BP: (!) 101/57  Pulse: 84  Weight: 119 lb (54 kg)  Body mass index is 18.64 kg/m.        Physical Examination:   General appearance: Well appearing, and in no distress  Mental status: Alert, oriented to person, place, and time  Skin: Warm & dry  Cardiovascular: Normal heart rate noted  Respiratory: Normal respiratory effort, no distress  Abdomen: Soft, gravid, nontender  Pelvic: Cervical exam deferred         Extremities: Edema: None  Fetal Status:     Movement: Present    Results for orders placed or performed in visit on 12/06/18 (from the past 24 hour(s))  POC Urinalysis Dipstick OB   Collection Time: 12/06/18 10:00 AM  Result Value Ref Range   Color, UA     Clarity, UA     Glucose, UA Negative Negative   Bilirubin, UA     Ketones, UA neg    Spec Grav, UA     Blood, UA neg    pH, UA     POC,PROTEIN,UA Negative Negative, Trace, Small (1+), Moderate (2+), Large (3+), 4+   Urobilinogen, UA     Nitrite, UA neg    Leukocytes, UA Negative Negative   Appearance     Odor      Assessment & Plan:  1) Low-risk pregnancy [id number] at 5665w6d with an Estimated Date of Delivery: 05/03/19   2) normal sonogram today   Meds: No orders of the defined types were placed in this encounter.  Labs/procedures today: sonogram  Plan:  Continue routine obstetrical care   Reviewed: Preterm labor symptoms and general obstetric precautions including but not limited to vaginal bleeding, contractions, leaking of fluid and fetal movement were reviewed in detail with the patient.  All questions were answered  Follow-up: Return in about 4 weeks (around 01/03/2019) for LROB.  Orders Placed This Encounter  Procedures  . POC Urinalysis Dipstick OB   [name]  12/06/2018 10:13 AM

## 2018-12-25 NOTE — L&D Delivery Note (Signed)
Patient: Patricia Vaughn MRN: [id number]  GBS status: Negative, IAP given: None   Patient is a 28 y.o. now G5P3 s/p NSVD at [redacted]w[redacted]d, who was admitted for SOL with complete cervical dilation. SROM 0h 77m prior to delivery with clear fluid.    Delivery Note At 1:59 PM a viable female was delivered via Vaginal, Spontaneous (Presentation: OA).  APGAR: 9,9 ; weight pending.   Placenta status: manual extraction, complete/intact.  Cord: 3 vessel with the following complications: cord avulsion.    Anesthesia:  None  Episiotomy: None Lacerations: 1st degree Suture Repair: None  Est. Blood Loss (mL): 402  Patient transferred from [facility], complete with urge to push. Unable to obtain IV access prior to delivery. Head delivered OA. No nuchal cord present. Shoulder and body delivered in usual fashion. Infant with spontaneous cry, placed on mother's abdomen, dried and bulb suctioned. Cord clamped x 2 after 1-minute delay, and cut by family member. Cord blood drawn. 10 mg IM Pitocin given for active management of third stage of labor. While placing gentle cord traction, cord avulsion occurred with membranous ending, consistent with likely velamentous attachment of cord. Patient given Fentanyl 100 mg IM for pain control. Manual extraction performed and placenta appeared to be complete and intact. Second sweep yielded blood clots but no placental fragments. Due to manual extraction patient was given Ancef 2g as antibiotic prophylaxis. Placenta and cord were sent to pathology. Fundus firm with massage and Pitocin but continued to have expulsion of blood clots and slow trickle of bleeding. In and out catheter performed yielding 175 cc of yellow urine. Fundus firm without further significant bleeding. Perineum inspected and found to have shallow first degree laceration, which was found to be hemostatic with good tissue re-approximation and was therefore not repaired.    Mom to postpartum.  Baby to Couplet care / Skin to  Skin.  De Hollingshead 04/30/2019, 2:31 PM

## 2019-01-03 ENCOUNTER — Ambulatory Visit (INDEPENDENT_AMBULATORY_CARE_PROVIDER_SITE_OTHER): Payer: Medicaid Other | Admitting: Women's Health

## 2019-01-03 ENCOUNTER — Encounter: Payer: Self-pay | Admitting: Women's Health

## 2019-01-03 VITALS — BP 94/57 | HR 97 | Wt 125.4 lb

## 2019-01-03 DIAGNOSIS — Z331 Pregnant state, incidental: Secondary | ICD-10-CM

## 2019-01-03 DIAGNOSIS — Z3A22 22 weeks gestation of pregnancy: Secondary | ICD-10-CM

## 2019-01-03 DIAGNOSIS — Z1389 Encounter for screening for other disorder: Secondary | ICD-10-CM

## 2019-01-03 DIAGNOSIS — Z3482 Encounter for supervision of other normal pregnancy, second trimester: Secondary | ICD-10-CM

## 2019-01-03 NOTE — Progress Notes (Signed)
   LOW-RISK PREGNANCY VISIT Patient name: Patricia Vaughn MRN [id number]  Date of birth: June 20, 1991 Chief Complaint:   Routine Prenatal Visit  History of Present Illness:   Patricia Vaughn is a 28 y.o. [id number] female at [redacted]w[redacted]d with an Estimated Date of Delivery: 05/03/19 being seen today for ongoing management of a low-risk pregnancy.  Today she reports no complaints.  . Vag. Bleeding: None.  Movement: Present. denies leaking of fluid. Review of Systems:   Pertinent items are noted in HPI Denies abnormal vaginal discharge w/ itching/odor/irritation, headaches, visual changes, shortness of breath, chest pain, abdominal pain, severe nausea/vomiting, or problems with urination or bowel movements unless otherwise stated above. Pertinent History Reviewed:  Reviewed past medical,surgical, social, obstetrical and family history.  Reviewed problem list, medications and allergies. Physical Assessment:   Vitals:   01/03/19 1102  BP: (!) 94/57  Pulse: 97  Weight: 125 lb 6.4 oz (56.9 kg)  Body mass index is 19.64 kg/m.        Physical Examination:   General appearance: Well appearing, and in no distress  Mental status: Alert, oriented to person, place, and time  Skin: Warm & dry  Cardiovascular: Normal heart rate noted  Respiratory: Normal respiratory effort, no distress  Abdomen: Soft, gravid, nontender  Pelvic: Cervical exam deferred         Extremities: Edema: None  Fetal Status: Fetal Heart Rate (bpm): 141 Fundal Height: 23 cm Movement: Present    No results found for this or any previous visit (from the past 24 hour(s)).  Assessment & Plan:  1) Low-risk pregnancy [id number] at [redacted]w[redacted]d with an Estimated Date of Delivery: 05/03/19    Meds: No orders of the defined types were placed in this encounter.  Labs/procedures today: none  Plan:  Continue routine obstetrical care   Reviewed: Preterm labor symptoms and general obstetric precautions including but not limited to vaginal bleeding,  contractions, leaking of fluid and fetal movement were reviewed in detail with the patient.  All questions were answered  Follow-up: Return in about 4 weeks (around 01/31/2019) for LROB, PN2.  Orders Placed This Encounter  Procedures  . POC Urinalysis Dipstick OB   Cheral Marker CNM, [facility] 01/03/2019 11:51 AM

## 2019-01-03 NOTE — Patient Instructions (Signed)
Patricia Vaughn, I greatly value your feedback.  If you receive a survey following your visit with [place] today, we appreciate you taking the time to fill it out.  Thanks, Joellyn Haff, CNM, WHNP-BC   You will have your sugar test next visit.  Please do not eat or drink anything after midnight the night before you come, not even water.  You will be here for at least two hours.     Call the office [phone]) or go to [facility] if:  You begin to have strong, frequent contractions  Your water breaks.  Sometimes it is a big gush of fluid, sometimes it is just a trickle that keeps getting your panties wet or running down your legs  You have vaginal bleeding.  It is normal to have a small amount of spotting if your cervix was checked.   You don't feel your baby moving like normal.  If you don't, get you something to eat and drink and lay down and focus on feeling your baby move.   If your baby is still not moving like normal, you should call the office or go to [facility].  Second Trimester of Pregnancy The second trimester is from week 13 through week 28, months 4 through 6. The second trimester is often a time when you feel your best. Your body has also adjusted to being pregnant, and you begin to feel better physically. Usually, morning sickness has lessened or quit completely, you may have more energy, and you may have an increase in appetite. The second trimester is also a time when the fetus is growing rapidly. At the end of the sixth month, the fetus is about 9 inches long and weighs about 1 pounds. You will likely begin to feel the baby move (quickening) between 18 and 20 weeks of the pregnancy. BODY CHANGES Your body goes through many changes during pregnancy. The changes vary from woman to woman.   Your weight will continue to increase. You will notice your lower abdomen bulging out.  You may begin to get stretch marks on your hips, abdomen, and breasts.  You may develop headaches  that can be relieved by medicines approved by your health care provider.  You may urinate more often because the fetus is pressing on your bladder.  You may develop or continue to have heartburn as a result of your pregnancy.  You may develop constipation because certain hormones are causing the muscles that push waste through your intestines to slow down.  You may develop hemorrhoids or swollen, bulging veins (varicose veins).  You may have back pain because of the weight gain and pregnancy hormones relaxing your joints between the bones in your pelvis and as a result of a shift in weight and the muscles that support your balance.  Your breasts will continue to grow and be tender.  Your gums may bleed and may be sensitive to brushing and flossing.  Dark spots or blotches (chloasma, mask of pregnancy) may develop on your face. This will likely fade after the baby is born.  A dark line from your belly button to the pubic area (linea nigra) may appear. This will likely fade after the baby is born.  You may have changes in your hair. These can include thickening of your hair, rapid growth, and changes in texture. Some women also have hair loss during or after pregnancy, or hair that feels dry or thin. Your hair will most likely return to normal after your  baby is born. WHAT TO EXPECT AT YOUR PRENATAL VISITS During a routine prenatal visit:  You will be weighed to make sure you and the fetus are growing normally.  Your blood pressure will be taken.  Your abdomen will be measured to track your baby's growth.  The fetal heartbeat will be listened to.  Any test results from the previous visit will be discussed. Your health care provider may ask you:  How you are feeling.  If you are feeling the baby move.  If you have had any abnormal symptoms, such as leaking fluid, bleeding, severe headaches, or abdominal cramping.  If you have any questions. Other tests that may be performed  during your second trimester include:  Blood tests that check for:  Low iron levels (anemia).  Gestational diabetes (between 24 and 28 weeks).  Rh antibodies.  Urine tests to check for infections, diabetes, or protein in the urine.  An ultrasound to confirm the proper growth and development of the baby.  An amniocentesis to check for possible genetic problems.  Fetal screens for spina bifida and Down syndrome. HOME CARE INSTRUCTIONS   Avoid all smoking, herbs, alcohol, and unprescribed drugs. These chemicals affect the formation and growth of the baby.  Follow your health care provider's instructions regarding medicine use. There are medicines that are either safe or unsafe to take during pregnancy.  Exercise only as directed by your health care provider. Experiencing uterine cramps is a good sign to stop exercising.  Continue to eat regular, healthy meals.  Wear a good support bra for breast tenderness.  Do not use hot tubs, steam rooms, or saunas.  Wear your seat belt at all times when driving.  Avoid raw meat, uncooked cheese, cat litter boxes, and soil used by cats. These carry germs that can cause birth defects in the baby.  Take your prenatal vitamins.  Try taking a stool softener (if your health care provider approves) if you develop constipation. Eat more high-fiber foods, such as fresh vegetables or fruit and whole grains. Drink plenty of fluids to keep your urine clear or pale yellow.  Take warm sitz baths to soothe any pain or discomfort caused by hemorrhoids. Use hemorrhoid cream if your health care provider approves.  If you develop varicose veins, wear support hose. Elevate your feet for 15 minutes, 3-4 times a day. Limit salt in your diet.  Avoid heavy lifting, wear low heel shoes, and practice good posture.  Rest with your legs elevated if you have leg cramps or low back pain.  Visit your dentist if you have not gone yet during your pregnancy. Use a soft  toothbrush to brush your teeth and be gentle when you floss.  A sexual relationship may be continued unless your health care provider directs you otherwise.  Continue to go to all your prenatal visits as directed by your health care provider. SEEK MEDICAL CARE IF:   You have dizziness.  You have mild pelvic cramps, pelvic pressure, or nagging pain in the abdominal area.  You have persistent nausea, vomiting, or diarrhea.  You have a bad smelling vaginal discharge.  You have pain with urination. SEEK IMMEDIATE MEDICAL CARE IF:   You have a fever.  You are leaking fluid from your vagina.  You have spotting or bleeding from your vagina.  You have severe abdominal cramping or pain.  You have rapid weight gain or loss.  You have shortness of breath with chest pain.  You notice sudden or extreme  swelling of your face, hands, ankles, feet, or legs.  You have not felt your baby move in over an hour.  You have severe headaches that do not go away with medicine.  You have vision changes. Document Released: 12/05/2001 Document Revised: 12/16/2013 Document Reviewed: 02/11/2013 [facility] Patient Information 2015 [place], [place]. This information is not intended to replace advice given to you by your health care provider. Make sure you discuss any questions you have with your health care provider.

## 2019-01-31 ENCOUNTER — Other Ambulatory Visit: Payer: Medicaid Other

## 2019-01-31 ENCOUNTER — Encounter: Payer: Medicaid Other | Admitting: Obstetrics & Gynecology

## 2019-02-13 ENCOUNTER — Ambulatory Visit (INDEPENDENT_AMBULATORY_CARE_PROVIDER_SITE_OTHER): Payer: Medicaid Other | Admitting: Obstetrics & Gynecology

## 2019-02-13 ENCOUNTER — Other Ambulatory Visit: Payer: Self-pay

## 2019-02-13 ENCOUNTER — Encounter: Payer: Self-pay | Admitting: Obstetrics & Gynecology

## 2019-02-13 ENCOUNTER — Other Ambulatory Visit: Payer: Medicaid Other

## 2019-02-13 VITALS — BP 112/54 | HR 82 | Wt 134.0 lb

## 2019-02-13 DIAGNOSIS — Z3483 Encounter for supervision of other normal pregnancy, third trimester: Secondary | ICD-10-CM

## 2019-02-13 DIAGNOSIS — Z1389 Encounter for screening for other disorder: Secondary | ICD-10-CM

## 2019-02-13 DIAGNOSIS — Z3A28 28 weeks gestation of pregnancy: Secondary | ICD-10-CM

## 2019-02-13 DIAGNOSIS — Z331 Pregnant state, incidental: Secondary | ICD-10-CM

## 2019-02-13 MED ORDER — OMEPRAZOLE 20 MG PO CPDR: 20.0000 mg | DELAYED_RELEASE_CAPSULE | Freq: Every day | ORAL | 6 refills | Status: DC

## 2019-02-13 NOTE — Progress Notes (Signed)
   LOW-RISK PREGNANCY VISIT Patient name: Patricia Vaughn MRN [id number]  Date of birth: May 15, 1991 Chief Complaint:   Routine Prenatal Visit (PN2)  History of Present Illness:   Patricia Vaughn is a 28 y.o. [id number] female at [redacted]w[redacted]d with an Estimated Date of Delivery: 05/03/19 being seen today for ongoing management of a low-risk pregnancy.  Today she reports no complaints.  . Vag. Bleeding: None.  Movement: Present. denies leaking of fluid. Review of Systems:   Pertinent items are noted in HPI Denies abnormal vaginal discharge w/ itching/odor/irritation, headaches, visual changes, shortness of breath, chest pain, abdominal pain, severe nausea/vomiting, or problems with urination or bowel movements unless otherwise stated above. Pertinent History Reviewed:  Reviewed past medical,surgical, social, obstetrical and family history.  Reviewed problem list, medications and allergies. Physical Assessment:   Vitals:   02/13/19 0919  BP: (!) 112/54  Pulse: 82  Weight: 134 lb (60.8 kg)  Body mass index is 20.99 kg/m.        Physical Examination:   General appearance: Well appearing, and in no distress  Mental status: Alert, oriented to person, place, and time  Skin: Warm & dry  Cardiovascular: Normal heart rate noted  Respiratory: Normal respiratory effort, no distress  Abdomen: Soft, gravid, nontender  Pelvic: Cervical exam deferred         Extremities: Edema: None  Fetal Status:     Movement: Present    No results found for this or any previous visit (from the past 24 hour(s)).  Assessment & Plan:  1) Low-risk pregnancy [id number] at [redacted]w[redacted]d with an Estimated Date of Delivery: 05/03/19   2) GERD, prilosec 20 daily   Meds:  Meds ordered this encounter  Medications  . omeprazole (PRILOSEC) 20 MG capsule    Sig: Take 1 capsule (20 mg total) by mouth daily. 1 tablet a day    Dispense:  30 capsule    Refill:  6   Labs/procedures today: PN2  Plan:  Continue routine obstetrical care    Reviewed: Preterm labor symptoms and general obstetric precautions including but not limited to vaginal bleeding, contractions, leaking of fluid and fetal movement were reviewed in detail with the patient.  All questions were answered  Follow-up: Return in about 3 weeks (around 03/06/2019) for LROB.  Orders Placed This Encounter  Procedures  . POC Urinalysis Dipstick OB   Amaryllis Dyke Teylor Wolven  02/13/2019 10:06 AM

## 2019-02-14 LAB — ANTIBODY SCREEN: Antibody Screen: NEGATIVE

## 2019-02-14 LAB — CBC
HEMATOCRIT: 31.9 % — AB (ref 34.0–46.6)
Hemoglobin: 10.2 g/dL — ABNORMAL LOW (ref 11.1–15.9)
MCH: 26.2 pg — ABNORMAL LOW (ref 26.6–33.0)
MCHC: 32 g/dL (ref 31.5–35.7)
MCV: 82 fL (ref 79–97)
Platelets: 125 10*3/uL — ABNORMAL LOW (ref 150–450)
RBC: 3.9 x10E6/uL (ref 3.77–5.28)
RDW: 13.2 % (ref 11.7–15.4)
WBC: 9.8 10*3/uL (ref 3.4–10.8)

## 2019-02-14 LAB — GLUCOSE TOLERANCE, 2 HOURS W/ 1HR
Glucose, 1 hour: 124 mg/dL (ref 65–179)
Glucose, 2 hour: 89 mg/dL (ref 65–152)
Glucose, Fasting: 81 mg/dL (ref 65–91)

## 2019-02-14 LAB — RPR: RPR Ser Ql: NONREACTIVE

## 2019-02-14 LAB — HIV ANTIBODY (ROUTINE TESTING W REFLEX): HIV Screen 4th Generation wRfx: NONREACTIVE

## 2019-03-10 ENCOUNTER — Ambulatory Visit (INDEPENDENT_AMBULATORY_CARE_PROVIDER_SITE_OTHER): Payer: Medicaid Other | Admitting: Family Medicine

## 2019-03-10 ENCOUNTER — Encounter: Payer: Self-pay | Admitting: Family Medicine

## 2019-03-10 ENCOUNTER — Other Ambulatory Visit: Payer: Self-pay

## 2019-03-10 VITALS — BP 105/62 | HR 74 | Wt 142.4 lb

## 2019-03-10 DIAGNOSIS — A749 Chlamydial infection, unspecified: Secondary | ICD-10-CM

## 2019-03-10 DIAGNOSIS — O98813 Other maternal infectious and parasitic diseases complicating pregnancy, third trimester: Secondary | ICD-10-CM

## 2019-03-10 DIAGNOSIS — Z3493 Encounter for supervision of normal pregnancy, unspecified, third trimester: Secondary | ICD-10-CM

## 2019-03-10 DIAGNOSIS — Z3A32 32 weeks gestation of pregnancy: Secondary | ICD-10-CM

## 2019-03-10 NOTE — Patient Instructions (Signed)
Go to the MAU (maternity admission unit) for 1) Strong contractions every 2-3 minutes for at least 1 hour that do not go away when you drink water or take a warm shower. These contractions will be so strong all you can do is breath through them 2) Vaginal bleeding- anything more than spotting 3) Loss of fluid like you broke your water 4) Decreased movement of your baby      Breastfeeding  Choosing to breastfeed is one of the best decisions you can make for yourself and your baby. A change in hormones during pregnancy causes your breasts to make breast milk in your milk-producing glands. Hormones prevent breast milk from being released before your baby is born. They also prompt milk flow after birth. Once breastfeeding has begun, thoughts of your baby, as well as his or her sucking or crying, can stimulate the release of milk from your milk-producing glands. Benefits of breastfeeding Research shows that breastfeeding offers many health benefits for infants and mothers. It also offers a cost-free and convenient way to feed your baby. For your baby  Your first milk (colostrum) helps your baby's digestive system to function better.  Special cells in your milk (antibodies) help your baby to fight off infections.  Breastfed babies are less likely to develop asthma, allergies, obesity, or type 2 diabetes. They are also at lower risk for sudden infant death syndrome (SIDS).  Nutrients in breast milk are better able to meet your baby's needs compared to infant formula.  Breast milk improves your baby's brain development. For you  Breastfeeding helps to create a very special bond between you and your baby.  Breastfeeding is convenient. Breast milk costs nothing and is always available at the correct temperature.  Breastfeeding helps to burn calories. It helps you to lose the weight that you gained during pregnancy.  Breastfeeding makes your uterus return faster to its size before pregnancy.  It also slows bleeding (lochia) after you give birth.  Breastfeeding helps to lower your risk of developing type 2 diabetes, osteoporosis, rheumatoid arthritis, cardiovascular disease, and breast, ovarian, uterine, and endometrial cancer later in life. Breastfeeding basics Starting breastfeeding  Find a comfortable place to sit or lie down, with your neck and back well-supported.  Place a pillow or a rolled-up blanket under your baby to bring him or her to the level of your breast (if you are seated). Nursing pillows are specially designed to help support your arms and your baby while you breastfeed.  Make sure that your baby's tummy (abdomen) is facing your abdomen.  Gently massage your breast. With your fingertips, massage from the outer edges of your breast inward toward the nipple. This encourages milk flow. If your milk flows slowly, you may need to continue this action during the feeding.  Support your breast with 4 fingers underneath and your thumb above your nipple (make the letter "C" with your hand). Make sure your fingers are well away from your nipple and your baby's mouth.  Stroke your baby's lips gently with your finger or nipple.  When your baby's mouth is open wide enough, quickly bring your baby to your breast, placing your entire nipple and as much of the areola as possible into your baby's mouth. The areola is the colored area around your nipple. ? More areola should be visible above your baby's upper lip than below the lower lip. ? Your baby's lips should be opened and extended outward (flanged) to ensure an adequate, comfortable latch. ? Your baby's  tongue should be between his or her lower gum and your breast.  Make sure that your baby's mouth is correctly positioned around your nipple (latched). Your baby's lips should create a seal on your breast and be turned out (everted).  It is common for your baby to suck about 2-3 minutes in order to start the flow of breast  milk. Latching Teaching your baby how to latch onto your breast properly is very important. An improper latch can cause nipple pain, decreased milk supply, and poor weight gain in your baby. Also, if your baby is not latched onto your nipple properly, he or she may swallow some air during feeding. This can make your baby fussy. Burping your baby when you switch breasts during the feeding can help to get rid of the air. However, teaching your baby to latch on properly is still the best way to prevent fussiness from swallowing air while breastfeeding. Signs that your baby has successfully latched onto your nipple  Silent tugging or silent sucking, without causing you pain. Infant's lips should be extended outward (flanged).  Swallowing heard between every 3-4 sucks once your milk has started to flow (after your let-down milk reflex occurs).  Muscle movement above and in front of his or her ears while sucking. Signs that your baby has not successfully latched onto your nipple  Sucking sounds or smacking sounds from your baby while breastfeeding.  Nipple pain. If you think your baby has not latched on correctly, slip your finger into the corner of your baby's mouth to break the suction and place it between your baby's gums. Attempt to start breastfeeding again. Signs of successful breastfeeding Signs from your baby  Your baby will gradually decrease the number of sucks or will completely stop sucking.  Your baby will fall asleep.  Your baby's body will relax.  Your baby will retain a small amount of milk in his or her mouth.  Your baby will let go of your breast by himself or herself. Signs from you  Breasts that have increased in firmness, weight, and size 1-3 hours after feeding.  Breasts that are softer immediately after breastfeeding.  Increased milk volume, as well as a change in milk consistency and color by the fifth day of breastfeeding.  Nipples that are not sore, cracked, or  bleeding. Signs that your baby is getting enough milk  Wetting at least 1-2 diapers during the first 24 hours after birth.  Wetting at least 5-6 diapers every 24 hours for the first week after birth. The urine should be clear or pale yellow by the age of 5 days.  Wetting 6-8 diapers every 24 hours as your baby continues to grow and develop.  At least 3 stools in a 24-hour period by the age of 5 days. The stool should be soft and yellow.  At least 3 stools in a 24-hour period by the age of 7 days. The stool should be seedy and yellow.  No loss of weight greater than 10% of birth weight during the first 3 days of life.  Average weight gain of 4-7 oz (113-198 g) per week after the age of 4 days.  Consistent daily weight gain by the age of 5 days, without weight loss after the age of 2 weeks. After a feeding, your baby may spit up a small amount of milk. This is normal. Breastfeeding frequency and duration Frequent feeding will help you make more milk and can prevent sore nipples and extremely full breasts (  breast engorgement). Breastfeed when you feel the need to reduce the fullness of your breasts or when your baby shows signs of hunger. This is called "breastfeeding on demand." Signs that your baby is hungry include:  Increased alertness, activity, or restlessness.  Movement of the head from side to side.  Opening of the mouth when the corner of the mouth or cheek is stroked (rooting).  Increased sucking sounds, smacking lips, cooing, sighing, or squeaking.  Hand-to-mouth movements and sucking on fingers or hands.  Fussing or crying. Avoid introducing a pacifier to your baby in the first 4-6 weeks after your baby is born. After this time, you may choose to use a pacifier. Research has shown that pacifier use during the first year of a baby's life decreases the risk of sudden infant death syndrome (SIDS). Allow your baby to feed on each breast as long as he or she wants. When your  baby unlatches or falls asleep while feeding from the first breast, offer the second breast. Because newborns are often sleepy in the first few weeks of life, you may need to awaken your baby to get him or her to feed. Breastfeeding times will vary from baby to baby. However, the following rules can serve as a guide to help you make sure that your baby is properly fed:  Newborns (babies 73 weeks of age or younger) may breastfeed every 1-3 hours.  Newborns should not go without breastfeeding for longer than 3 hours during the day or 5 hours during the night.  You should breastfeed your baby a minimum of 8 times in a 24-hour period. Breast milk pumping     Pumping and storing breast milk allows you to make sure that your baby is exclusively fed your breast milk, even at times when you are unable to breastfeed. This is especially important if you go back to work while you are still breastfeeding, or if you are not able to be present during feedings. Your lactation consultant can help you find a method of pumping that works best for you and give you guidelines about how long it is safe to store breast milk. Caring for your breasts while you breastfeed Nipples can become dry, cracked, and sore while breastfeeding. The following recommendations can help keep your breasts moisturized and healthy:  Avoid using soap on your nipples.  Wear a supportive bra designed especially for nursing. Avoid wearing underwire-style bras or extremely tight bras (sports bras).  Air-dry your nipples for 3-4 minutes after each feeding.  Use only cotton bra pads to absorb leaked breast milk. Leaking of breast milk between feedings is normal.  Use lanolin on your nipples after breastfeeding. Lanolin helps to maintain your skin's normal moisture barrier. Pure lanolin is not harmful (not toxic) to your baby. You may also hand express a few drops of breast milk and gently massage that milk into your nipples and allow the milk  to air-dry. In the first few weeks after giving birth, some women experience breast engorgement. Engorgement can make your breasts feel heavy, warm, and tender to the touch. Engorgement peaks within 3-5 days after you give birth. The following recommendations can help to ease engorgement:  Completely empty your breasts while breastfeeding or pumping. You may want to start by applying warm, moist heat (in the shower or with warm, water-soaked hand towels) just before feeding or pumping. This increases circulation and helps the milk flow. If your baby does not completely empty your breasts while breastfeeding, pump any  extra milk after he or she is finished.  Apply ice packs to your breasts immediately after breastfeeding or pumping, unless this is too uncomfortable for you. To do this: ? Put ice in a plastic bag. ? Place a towel between your skin and the bag. ? Leave the ice on for 20 minutes, 2-3 times a day.  Make sure that your baby is latched on and positioned properly while breastfeeding. If engorgement persists after 48 hours of following these recommendations, contact your health care provider or a [employer]. Overall health care recommendations while breastfeeding  Eat 3 healthy meals and 3 snacks every day. Well-nourished mothers who are breastfeeding need an additional 450-500 calories a day. You can meet this requirement by increasing the amount of a balanced diet that you eat.  Drink enough water to keep your urine pale yellow or clear.  Rest often, relax, and continue to take your prenatal vitamins to prevent fatigue, stress, and low vitamin and mineral levels in your body (nutrient deficiencies).  Do not use any products that contain nicotine or tobacco, such as cigarettes and e-cigarettes. Your baby may be harmed by chemicals from cigarettes that pass into breast milk and exposure to secondhand smoke. If you need help quitting, ask your health care provider.  Avoid  alcohol.  Do not use illegal drugs or marijuana.  Talk with your health care provider before taking any medicines. These include over-the-counter and prescription medicines as well as vitamins and herbal supplements. Some medicines that may be harmful to your baby can pass through breast milk.  It is possible to become pregnant while breastfeeding. If birth control is desired, ask your health care provider about options that will be safe while breastfeeding your baby. Where to find more information: [employer]: www.llli.org Contact a health care provider if:  You feel like you want to stop breastfeeding or have become frustrated with breastfeeding.  Your nipples are cracked or bleeding.  Your breasts are red, tender, or warm.  You have: ? Painful breasts or nipples. ? A swollen area on either breast. ? A fever or chills. ? Nausea or vomiting. ? Drainage other than breast milk from your nipples.  Your breasts do not become full before feedings by the fifth day after you give birth.  You feel sad and depressed.  Your baby is: ? Too sleepy to eat well. ? Having trouble sleeping. ? More than 69 week old and wetting fewer than 6 diapers in a 24-hour period. ? Not gaining weight by 18 days of age.  Your baby has fewer than 3 stools in a 24-hour period.  Your baby's skin or the white parts of his or her eyes become yellow. Get help right away if:  Your baby is overly tired (lethargic) and does not want to wake up and feed.  Your baby develops an unexplained fever. Summary  Breastfeeding offers many health benefits for infant and mothers.  Try to breastfeed your infant when he or she shows early signs of hunger.  Gently tickle or stroke your baby's lips with your finger or nipple to allow the baby to open his or her mouth. Bring the baby to your breast. Make sure that much of the areola is in your baby's mouth. Offer one side and burp the baby before you offer  the other side.  Talk with your health care provider or lactation consultant if you have questions or you face problems as you breastfeed. This information is not  intended to replace advice given to you by your health care provider. Make sure you discuss any questions you have with your health care provider. Document Released: 12/11/2005 Document Revised: 01/12/2017 Document Reviewed: 01/12/2017 [employer] Interactive Patient Education  2019 [employer].

## 2019-03-10 NOTE — Progress Notes (Signed)
    PRENATAL VISIT NOTE  Subjective:  Patricia Vaughn is a 28 y.o. [phone] at [redacted]w[redacted]d being seen today for ongoing prenatal care.  She is currently monitored for the following issues for this low-risk pregnancy and has Supervision of normal pregnancy and Chlamydia on their problem list.  Patient reports no complaints.  Contractions: Not present. Vag. Bleeding: None.  Movement: Present. Denies leaking of fluid.   The following portions of the patient's history were reviewed and updated as appropriate: allergies, current medications, past family history, past medical history, past social history, past surgical history and problem list. Problem list updated.  Objective:   Vitals:   03/10/19 1132  BP: 105/62  Pulse: 74  Weight: 142 lb 6.4 oz (64.6 kg)    Fetal Status: Fetal Heart Rate (bpm): 135 Fundal Height: 32 cm Movement: Present     General:  Alert, oriented and cooperative. Patient is in no acute distress.  Skin: Skin is warm and dry. No rash noted.   Cardiovascular: Normal heart rate noted  Respiratory: Normal respiratory effort, no problems with respiration noted  Abdomen: Soft, gravid, appropriate for gestational age.  Pain/Pressure: Present     Pelvic: Cervical exam deferred        Extremities: Normal range of motion.  Edema: None  Mental Status: Normal mood and affect. Normal behavior. Normal judgment and thought content.   Assessment and Plan:  Pregnancy: [id number] at [redacted]w[redacted]d  1. Encounter for supervision of normal pregnancy in third trimester, unspecified gravidity Discussed LARC today Reviewed preterm labor precuations  2. Chlamydia TOC neg, partner treated   Preterm labor symptoms and general obstetric precautions including but not limited to vaginal bleeding, contractions, leaking of fluid and fetal movement were reviewed in detail with the patient. Please refer to After Visit Summary for other counseling recommendations.  Return in about 2 weeks (around 03/24/2019) for  Routine prenatal care.  Future Appointments  Date Time Provider Department Center  03/24/2019 12:00 PM Eure, Amaryllis Dyke, MD [facility]    Federico Flake, MD

## 2019-03-21 ENCOUNTER — Telehealth: Payer: Self-pay | Admitting: Obstetrics & Gynecology

## 2019-03-21 NOTE — Telephone Encounter (Signed)
Pt aware of Restrictions and screening was done

## 2019-03-24 ENCOUNTER — Other Ambulatory Visit: Payer: Self-pay

## 2019-03-24 ENCOUNTER — Ambulatory Visit (INDEPENDENT_AMBULATORY_CARE_PROVIDER_SITE_OTHER): Payer: Medicaid Other | Admitting: Obstetrics & Gynecology

## 2019-03-24 VITALS — BP 99/58 | HR 85 | Wt 144.0 lb

## 2019-03-24 DIAGNOSIS — Z3493 Encounter for supervision of normal pregnancy, unspecified, third trimester: Secondary | ICD-10-CM

## 2019-03-24 DIAGNOSIS — Z3A34 34 weeks gestation of pregnancy: Secondary | ICD-10-CM

## 2019-03-24 NOTE — Progress Notes (Signed)
   LOW-RISK PREGNANCY VISIT Patient name: Patricia Vaughn MRN [id number]  Date of birth: [birth year] Chief Complaint:   Routine Prenatal Visit  History of Present Illness:   Patricia Vaughn is a 28 y.o. [id number] female at [redacted]w[redacted]d with an Estimated Date of Delivery: 05/03/19 being seen today for ongoing management of a low-risk pregnancy.  Today she reports no complaints. Contractions: Not present. Vag. Bleeding: None.  Movement: Present. denies leaking of fluid. Review of Systems:   Pertinent items are noted in HPI Denies abnormal vaginal discharge w/ itching/odor/irritation, headaches, visual changes, shortness of breath, chest pain, abdominal pain, severe nausea/vomiting, or problems with urination or bowel movements unless otherwise stated above. Pertinent History Reviewed:  Reviewed past medical,surgical, social, obstetrical and family history.  Reviewed problem list, medications and allergies. Physical Assessment:   Vitals:   03/24/19 1214  BP: (!) 99/58  Pulse: 85  Weight: 144 lb (65.3 kg)  Body mass index is 22.55 kg/m.        Physical Examination:   General appearance: Well appearing, and in no distress  Mental status: Alert, oriented to person, place, and time  Skin: Warm & dry  Cardiovascular: Normal heart rate noted  Respiratory: Normal respiratory effort, no distress  Abdomen: Soft, gravid, nontender  Pelvic: Cervical exam deferred         Extremities: Edema: None  Fetal Status: Fetal Heart Rate (bpm): 132 Fundal Height: 34 cm Movement: Present    No results found for this or any previous visit (from the past 24 hour(s)).  Assessment & Plan:  1) Low-risk pregnancy [id number] at 110w2d with an Estimated Date of Delivery: 05/03/19      Meds: No orders of the defined types were placed in this encounter.  Labs/procedures today:   Plan:  Continue routine obstetrical care   Reviewed: Preterm labor symptoms and general obstetric precautions including but not limited to vaginal  bleeding, contractions, leaking of fluid and fetal movement were reviewed in detail with the patient.  All questions were answered  Follow-up: Return in about 2 weeks (around 04/07/2019) for cultures + OBV in office.  No orders of the defined types were placed in this encounter.  Lazaro Arms  03/24/2019 12:25 PM

## 2019-04-03 ENCOUNTER — Telehealth: Payer: Self-pay | Admitting: *Deleted

## 2019-04-03 NOTE — Telephone Encounter (Signed)

## 2019-04-07 ENCOUNTER — Other Ambulatory Visit: Payer: Self-pay

## 2019-04-07 ENCOUNTER — Ambulatory Visit (INDEPENDENT_AMBULATORY_CARE_PROVIDER_SITE_OTHER): Payer: Medicaid Other | Admitting: Obstetrics & Gynecology

## 2019-04-07 VITALS — BP 104/60 | HR 86 | Wt 144.0 lb

## 2019-04-07 DIAGNOSIS — Z3A36 36 weeks gestation of pregnancy: Secondary | ICD-10-CM | POA: Diagnosis not present

## 2019-04-07 DIAGNOSIS — Z3483 Encounter for supervision of other normal pregnancy, third trimester: Secondary | ICD-10-CM

## 2019-04-07 LAB — POCT URINALYSIS DIPSTICK OB
Blood, UA: NEGATIVE
Glucose, UA: NEGATIVE
Ketones, UA: NEGATIVE
LEUKOCYTES UA: NEGATIVE
Nitrite, UA: NEGATIVE
POC,PROTEIN,UA: NEGATIVE

## 2019-04-07 NOTE — Addendum Note (Signed)
Addended by: Sherre Lain A on: 04/07/2019 02:52 PM   Modules accepted: Orders

## 2019-04-07 NOTE — Progress Notes (Signed)
   LOW-RISK PREGNANCY VISIT Patient name: Patricia Vaughn MRN [id number]  Date of birth: 17-Jul-1991 Chief Complaint:   Routine Prenatal Visit (cultures)  History of Present Illness:   Patricia Vaughn is a 28 y.o. [id number] female at [redacted]w[redacted]d with an Estimated Date of Delivery: 05/03/19 being seen today for ongoing management of a low-risk pregnancy.  Today she reports no complaints. Contractions: Not present. Vag. Bleeding: None.  Movement: Present. denies leaking of fluid. Review of Systems:   Pertinent items are noted in HPI Denies abnormal vaginal discharge w/ itching/odor/irritation, headaches, visual changes, shortness of breath, chest pain, abdominal pain, severe nausea/vomiting, or problems with urination or bowel movements unless otherwise stated above. Pertinent History Reviewed:  Reviewed past medical,surgical, social, obstetrical and family history.  Reviewed problem list, medications and allergies. Physical Assessment:   Vitals:   04/07/19 1413  BP: 104/60  Pulse: 86  Weight: 144 lb (65.3 kg)  Body mass index is 22.55 kg/m.        Physical Examination:   General appearance: Well appearing, and in no distress  Mental status: Alert, oriented to person, place, and time  Skin: Warm & dry  Cardiovascular: Normal heart rate noted  Respiratory: Normal respiratory effort, no distress  Abdomen: Soft, gravid, nontender  Pelvic: Cervical exam performed         Extremities: Edema: None  Fetal Status:     Movement: Present    Results for orders placed or performed in visit on 02/13/19 (from the past 24 hour(s))  POC Urinalysis Dipstick OB   Collection Time: 04/07/19  2:15 PM  Result Value Ref Range   Color, UA     Clarity, UA     Glucose, UA Negative Negative   Bilirubin, UA     Ketones, UA neg    Spec Grav, UA     Blood, UA neg    pH, UA     POC,PROTEIN,UA Negative Negative, Trace, Small (1+), Moderate (2+), Large (3+), 4+   Urobilinogen, UA     Nitrite, UA neg    Leukocytes,  UA Negative Negative   Appearance     Odor      Assessment & Plan:  1) Low-risk pregnancy [id number] at [redacted]w[redacted]d with an Estimated Date of Delivery: 05/03/19   2)    Meds: No orders of the defined types were placed in this encounter.  Labs/procedures today: cultures  Plan:  Continue routine obstetrical care   Reviewed: Term labor symptoms and general obstetric precautions including but not limited to vaginal bleeding, contractions, leaking of fluid and fetal movement were reviewed in detail with the patient.  All questions were answered  Follow-up: Return in about 2 weeks (around 04/21/2019) for LROB. Increased interval due to COVID 19 No orders of the defined types were placed in this encounter.  Lazaro Arms  04/07/2019 2:40 PM

## 2019-04-08 LAB — GC/CHLAMYDIA PROBE AMP
Chlamydia trachomatis, NAA: NEGATIVE
Neisseria Gonorrhoeae by PCR: NEGATIVE

## 2019-04-11 LAB — CULTURE, BETA STREP (GROUP B ONLY): Strep Gp B Culture: NEGATIVE

## 2019-04-18 ENCOUNTER — Telehealth: Payer: Self-pay | Admitting: *Deleted

## 2019-04-18 NOTE — Telephone Encounter (Signed)

## 2019-04-21 ENCOUNTER — Encounter: Payer: Self-pay | Admitting: Obstetrics and Gynecology

## 2019-04-21 ENCOUNTER — Ambulatory Visit (INDEPENDENT_AMBULATORY_CARE_PROVIDER_SITE_OTHER): Payer: Medicaid Other | Admitting: Obstetrics and Gynecology

## 2019-04-21 ENCOUNTER — Other Ambulatory Visit: Payer: Self-pay

## 2019-04-21 VITALS — BP 97/58 | HR 83 | Temp 97.6°F | Wt 148.0 lb

## 2019-04-21 DIAGNOSIS — Z3A38 38 weeks gestation of pregnancy: Secondary | ICD-10-CM

## 2019-04-21 DIAGNOSIS — Z3493 Encounter for supervision of normal pregnancy, unspecified, third trimester: Secondary | ICD-10-CM

## 2019-04-21 DIAGNOSIS — Z331 Pregnant state, incidental: Secondary | ICD-10-CM

## 2019-04-21 DIAGNOSIS — Z1389 Encounter for screening for other disorder: Secondary | ICD-10-CM

## 2019-04-21 LAB — POCT URINALYSIS DIPSTICK OB
Blood, UA: NEGATIVE
Glucose, UA: NEGATIVE
Ketones, UA: NEGATIVE
Leukocytes, UA: NEGATIVE
Nitrite, UA: NEGATIVE
POC,PROTEIN,UA: NEGATIVE

## 2019-04-21 NOTE — Progress Notes (Signed)
Subjective:  Patricia Vaughn is a 28 y.o. [phone] at [redacted]w[redacted]d being seen today for ongoing prenatal care.  She is currently monitored for the following issues for this low-risk pregnancy and has Supervision of normal pregnancy on their problem list.  Patient reports occ ut ctx and general discomforts of pregnancy.  Contractions, irregular.  .  Movement, present Denies leaking of fluid.   The following portions of the patient's history were reviewed and updated as appropriate: allergies, current medications, past family history, past medical history, past social history, past surgical history and problem list. Problem list updated.  Objective:   Vitals:   04/21/19 1142  BP: (!) 97/58  Pulse: 83  Temp: 97.6 F (36.4 C)  Weight: 148 lb (67.1 kg)    Fetal Status:     Movement: Absent     General:  Alert, oriented and cooperative. Patient is in no acute distress.  Skin: Skin is warm and dry. No rash noted.   Cardiovascular: Normal heart rate noted  Respiratory: Normal respiratory effort, no problems with respiration noted  Abdomen: Soft, gravid, appropriate for gestational age. Pain/Pressure: Present     Pelvic:  Cervical exam performed        Extremities: Normal range of motion.  Edema: Trace  Mental Status: Normal mood and affect. Normal behavior. Normal judgment and thought content.   Urinalysis:      Assessment and Plan:  Pregnancy: [id number] at [redacted]w[redacted]d  1. Encounter for supervision of normal pregnancy in third trimester, unspecified gravidity Stable Labor precautions Has BP cuff to monitor BP   2. Pregnant state, incidental  - POC Urinalysis Dipstick OB  3. Screening for genitourinary condition  - POC Urinalysis Dipstick OB  Term labor symptoms and general obstetric precautions including but not limited to vaginal bleeding, contractions, leaking of fluid and fetal movement were reviewed in detail with the patient. Please refer to After Visit Summary for other counseling  recommendations.  Return in about 1 week (around 04/28/2019) for OB visit, televisit.   Hermina Staggers, MD

## 2019-04-21 NOTE — Patient Instructions (Signed)
Fetal Movement Counts  Patient Name: ________________________________________________ Patient Due Date: ____________________  What is a fetal movement count?    A fetal movement count is the number of times that you feel your baby move during a certain amount of time. This may also be called a fetal kick count. A fetal movement count is recommended for every pregnant woman. You may be asked to start counting fetal movements as early as week 28 of your pregnancy.  Pay attention to when your baby is most active. You may notice your baby's sleep and wake cycles. You may also notice things that make your baby move more. You should do a fetal movement count:  · When your baby is normally most active.  · At the same time each day.  A good time to count movements is while you are resting, after having something to eat and drink.  How do I count fetal movements?  1. Find a quiet, comfortable area. Sit, or lie down on your side.  2. Write down the date, the start time and stop time, and the number of movements that you felt between those two times. Take this information with you to your health care visits.  3. For 2 hours, count kicks, flutters, swishes, rolls, and jabs. You should feel at least 10 movements during 2 hours.  4. You may stop counting after you have felt 10 movements.  5. If you do not feel 10 movements in 2 hours, have something to eat and drink. Then, keep resting and counting for 1 hour. If you feel at least 4 movements during that hour, you may stop counting.  Contact a health care provider if:  · You feel fewer than 4 movements in 2 hours.  · Your baby is not moving like he or she usually does.  Date: ____________ Start time: ____________ Stop time: ____________ Movements: ____________  Date: ____________ Start time: ____________ Stop time: ____________ Movements: ____________  Date: ____________ Start time: ____________ Stop time: ____________ Movements: ____________  Date: ____________ Start time:  ____________ Stop time: ____________ Movements: ____________  Date: ____________ Start time: ____________ Stop time: ____________ Movements: ____________  Date: ____________ Start time: ____________ Stop time: ____________ Movements: ____________  Date: ____________ Start time: ____________ Stop time: ____________ Movements: ____________  Date: ____________ Start time: ____________ Stop time: ____________ Movements: ____________  Date: ____________ Start time: ____________ Stop time: ____________ Movements: ____________  This information is not intended to replace advice given to you by your health care provider. Make sure you discuss any questions you have with your health care provider.  Document Released: 01/10/2007 Document Revised: 08/09/2016 Document Reviewed: 01/20/2016  Elsevier Interactive Patient Education © 2019 [employer]  Vaginal Delivery    Vaginal delivery means that you give birth by pushing your baby out of your birth canal (vagina). A team of health care providers will help you before, during, and after vaginal delivery. Birth experiences are unique for every woman and every pregnancy, and birth experiences vary depending on where you choose to give birth.  What happens when I arrive at the birth center or hospital?  Once you are in labor and have been admitted into the hospital or birth center, your health care provider may:  · Review your pregnancy history and any concerns that you have.  · Insert an IV into one of your veins. This may be used to give you fluids and medicines.  · Check your blood pressure, pulse, temperature, and heart rate (vital signs).  ·   Check whether your bag of water (amniotic sac) has broken (ruptured).  · Talk with you about your birth plan and discuss pain control options.  Monitoring  Your health care provider may monitor your contractions (uterine monitoring) and your baby's heart rate (fetal monitoring). You may need to be monitored:  · Often, but not continuously  (intermittently).  · All the time or for long periods at a time (continuously). Continuous monitoring may be needed if:  ? You are taking certain medicines, such as medicine to relieve pain or make your contractions stronger.  ? You have pregnancy or labor complications.  Monitoring may be done by:  · Placing a special stethoscope or a handheld monitoring device on your abdomen to check your baby's heartbeat and to check for contractions.  · Placing monitors on your abdomen (external monitors) to record your baby's heartbeat and the frequency and length of contractions.  · Placing monitors inside your uterus through your vagina (internal monitors) to record your baby's heartbeat and the frequency, length, and strength of your contractions. Depending on the type of monitor, it may remain in your uterus or on your baby's head until birth.  · Telemetry. This is a type of continuous monitoring that can be done with external or internal monitors. Instead of having to stay in bed, you are able to move around during telemetry.  Physical exam  Your health care provider may perform frequent physical exams. This may include:  · Checking how and where your baby is positioned in your uterus.  · Checking your cervix to determine:  ? Whether it is thinning out (effacing).  ? Whether it is opening up (dilating).  What happens during labor and delivery?    Normal labor and delivery is divided into the following three stages:  Stage 1  · This is the longest stage of labor.  · This stage can last for hours or days.  · Throughout this stage, you will feel contractions. Contractions generally feel mild, infrequent, and irregular at first. They get stronger, more frequent (about every 2-3 minutes), and more regular as you move through this stage.  · This stage ends when your cervix is completely dilated to 4 inches (10 cm) and completely effaced.  Stage 2  · This stage starts once your cervix is completely effaced and dilated and lasts  until the delivery of your baby.  · This stage may last from 20 minutes to 2 hours.  · This is the stage where you will feel an urge to push your baby out of your vagina.  · You may feel stretching and burning pain, especially when the widest part of your baby's head passes through the vaginal opening (crowning).  · Once your baby is delivered, the umbilical cord will be clamped and cut. This usually occurs after waiting a period of 1-2 minutes after delivery.  · Your baby will be placed on your bare chest (skin-to-skin contact) in an upright position and covered with a warm blanket. Watch your baby for feeding cues, like rooting or sucking, and help the baby to your breast for his or her first feeding.  Stage 3  · This stage starts immediately after the birth of your baby and ends after you deliver the placenta.  · This stage may take anywhere from 5 to 30 minutes.  · After your baby has been delivered, you will feel contractions as your body expels the placenta and your uterus contracts to control bleeding.  What can   I expect after labor and delivery?  · After labor is over, you and your baby will be monitored closely until you are ready to go home to ensure that you are both healthy. Your health care team will teach you how to care for yourself and your baby.  · You and your baby will stay in the same room (rooming in) during your hospital stay. This will encourage early bonding and successful breastfeeding.  · You may continue to receive fluids and medicines through an IV.  · Your uterus will be checked and massaged regularly (fundal massage).  · You will have some soreness and pain in your abdomen, vagina, and the area of skin between your vaginal opening and your anus (perineum).  · If an incision was made near your vagina (episiotomy) or if you had some vaginal tearing during delivery, cold compresses may be placed on your episiotomy or your tear. This helps to reduce pain and swelling.  · You may be given a  squirt bottle to use instead of wiping when you go to the bathroom. To use the squirt bottle, follow these steps:  ? Before you urinate, fill the squirt bottle with warm water. Do not use hot water.  ? After you urinate, while you are sitting on the toilet, use the squirt bottle to rinse the area around your urethra and vaginal opening. This rinses away any urine and blood.  ? Fill the squirt bottle with clean water every time you use the bathroom.  · It is normal to have vaginal bleeding after delivery. Wear a sanitary pad for vaginal bleeding and discharge.  Summary  · Vaginal delivery means that you will give birth by pushing your baby out of your birth canal (vagina).  · Your health care provider may monitor your contractions (uterine monitoring) and your baby's heart rate (fetal monitoring).  · Your health care provider may perform a physical exam.  · Normal labor and delivery is divided into three stages.  · After labor is over, you and your baby will be monitored closely until you are ready to go home.  This information is not intended to replace advice given to you by your health care provider. Make sure you discuss any questions you have with your health care provider.  Document Released: 09/19/2008 Document Revised: 01/15/2018 Document Reviewed: 01/15/2018  Elsevier Interactive Patient Education © 2019 [employer]

## 2019-04-28 ENCOUNTER — Ambulatory Visit (INDEPENDENT_AMBULATORY_CARE_PROVIDER_SITE_OTHER): Payer: Medicaid Other | Admitting: Obstetrics and Gynecology

## 2019-04-28 ENCOUNTER — Encounter: Payer: Self-pay | Admitting: Obstetrics and Gynecology

## 2019-04-28 ENCOUNTER — Other Ambulatory Visit: Payer: Self-pay

## 2019-04-28 DIAGNOSIS — Z3A39 39 weeks gestation of pregnancy: Secondary | ICD-10-CM

## 2019-04-28 DIAGNOSIS — Z3483 Encounter for supervision of other normal pregnancy, third trimester: Secondary | ICD-10-CM

## 2019-04-28 NOTE — Progress Notes (Signed)
   TELEHEALTH VIRTUAL OBSTETRICS VISIT ENCOUNTER NOTE  I connected with Patricia Vaughn on 04/28/19 at  8:30 AM EDT by telephone at home and verified that I am speaking with the correct person using two identifiers.   I discussed the limitations, risks, security and privacy concerns of performing an evaluation and management service by telephone and the availability of in person appointments. I also discussed with the patient that there may be a patient responsible charge related to this service. The patient expressed understanding and agreed to proceed.  Subjective:  Patricia Vaughn is a 28 y.o. [phone] at [redacted]w[redacted]d being followed for ongoing prenatal care.  She is currently monitored for the following issues for this low-risk pregnancy and has Supervision of normal pregnancy on their problem list.  Patient reports general discomforts of pregnancy. Reports fetal movement. Denies any contractions, bleeding or leaking of fluid.   The following portions of the patient's history were reviewed and updated as appropriate: allergies, current medications, past family history, past medical history, past social history, past surgical history and problem list.   Objective:   General:  Alert, oriented and cooperative.   Mental Status: Normal mood and affect perceived. Normal judgment and thought content.  Rest of physical exam deferred due to type of encounter  Assessment and Plan:  Pregnancy: [id number] at [redacted]w[redacted]d 1. Encounter for supervision of other normal pregnancy in third trimester Stable Labor precautions IOL at 41 weeks F/U in 1 week for OB visit and NST  Term labor symptoms and general obstetric precautions including but not limited to vaginal bleeding, contractions, leaking of fluid and fetal movement were reviewed in detail with the patient.  I discussed the assessment and treatment plan with the patient. The patient was provided an opportunity to ask questions and all were answered. The patient  agreed with the plan and demonstrated an understanding of the instructions. The patient was advised to call back or seek an in-person office evaluation/go to [facility]U at [facility] for any urgent or concerning symptoms. Please refer to After Visit Summary for other counseling recommendations.   I provided 11 minutes of non-face-to-face time during this encounter.  Return in about 1 week (around 05/05/2019) for OB visit, face to face and NST.  No future appointments.  Hermina Staggers, MD [facility], [facility]

## 2019-04-30 ENCOUNTER — Other Ambulatory Visit: Payer: Self-pay

## 2019-04-30 ENCOUNTER — Encounter (HOSPITAL_COMMUNITY): Payer: Self-pay | Admitting: Emergency Medicine

## 2019-04-30 ENCOUNTER — Inpatient Hospital Stay (HOSPITAL_COMMUNITY)
Admission: EM | Admit: 2019-04-30 | Discharge: 2019-05-02 | DRG: 807 | Disposition: A | Discharge status: Home / Self Care | Payer: Medicaid Other | Attending: Obstetrics & Gynecology | Admitting: Obstetrics & Gynecology

## 2019-04-30 DIAGNOSIS — O98819 Other maternal infectious and parasitic diseases complicating pregnancy, unspecified trimester: Secondary | ICD-10-CM

## 2019-04-30 DIAGNOSIS — Z3A39 39 weeks gestation of pregnancy: Secondary | ICD-10-CM | POA: Diagnosis not present

## 2019-04-30 DIAGNOSIS — Z87891 Personal history of nicotine dependence: Secondary | ICD-10-CM

## 2019-04-30 DIAGNOSIS — O26893 Other specified pregnancy related conditions, third trimester: Secondary | ICD-10-CM | POA: Diagnosis present

## 2019-04-30 DIAGNOSIS — O9989 Other specified diseases and conditions complicating pregnancy, childbirth and the puerperium: Secondary | ICD-10-CM | POA: Diagnosis not present

## 2019-04-30 DIAGNOSIS — O99892 Other specified diseases and conditions complicating childbirth: Secondary | ICD-10-CM

## 2019-04-30 DIAGNOSIS — R52 Pain, unspecified: Secondary | ICD-10-CM | POA: Diagnosis not present

## 2019-04-30 DIAGNOSIS — A749 Chlamydial infection, unspecified: Secondary | ICD-10-CM | POA: Diagnosis not present

## 2019-04-30 LAB — ABO/RH: ABO/RH(D): B POS

## 2019-04-30 LAB — CBC
HCT: 37.9 % (ref 36.0–46.0)
Hemoglobin: 11.4 g/dL — ABNORMAL LOW (ref 12.0–15.0)
MCH: 23.8 pg — ABNORMAL LOW (ref 26.0–34.0)
MCHC: 30.1 g/dL (ref 30.0–36.0)
MCV: 79.1 fL — ABNORMAL LOW (ref 80.0–100.0)
Platelets: 138 10*3/uL — ABNORMAL LOW (ref 150–400)
RBC: 4.79 MIL/uL (ref 3.87–5.11)
RDW: 16.4 % — ABNORMAL HIGH (ref 11.5–15.5)
WBC: 16.5 10*3/uL — ABNORMAL HIGH (ref 4.0–10.5)
nRBC: 0 % (ref 0.0–0.2)

## 2019-04-30 LAB — TYPE AND SCREEN
ABO/RH(D): B POS
Antibody Screen: NEGATIVE

## 2019-04-30 MED ORDER — MEASLES, MUMPS & RUBELLA VAC IJ SOLR: 0.5000 mL | Freq: Once | INTRAMUSCULAR | Status: DC

## 2019-04-30 MED ORDER — FENTANYL CITRATE (PF) 100 MCG/2ML IJ SOLN
INTRAMUSCULAR | Status: AC
  Administered 2019-04-30: 100 ug via INTRAMUSCULAR
  Filled 2019-04-30: qty 2

## 2019-04-30 MED ORDER — CEFAZOLIN SODIUM-DEXTROSE 2-4 GM/100ML-% IV SOLN
2.0000 g | Freq: Once | INTRAVENOUS | Status: AC
  Administered 2019-04-30: 2 g via INTRAVENOUS
  Filled 2019-04-30: qty 100

## 2019-04-30 MED ORDER — BENZOCAINE-MENTHOL 20-0.5 % EX AERO: 1.0000 "application " | INHALATION_SPRAY | CUTANEOUS | Status: DC | PRN

## 2019-04-30 MED ORDER — OXYTOCIN BOLUS FROM INFUSION: 500.0000 mL | Freq: Once | INTRAVENOUS | Status: DC

## 2019-04-30 MED ORDER — ONDANSETRON HCL 4 MG/2ML IJ SOLN: 4.0000 mg | Freq: Four times a day (QID) | INTRAMUSCULAR | Status: DC | PRN

## 2019-04-30 MED ORDER — FENTANYL CITRATE (PF) 100 MCG/2ML IJ SOLN
100.0000 ug | Freq: Once | INTRAMUSCULAR | Status: AC
  Administered 2019-04-30: 100 ug via INTRAMUSCULAR

## 2019-04-30 MED ORDER — PRENATAL MULTIVITAMIN CH
1.0000 | ORAL_TABLET | Freq: Every day | ORAL | Status: DC
  Administered 2019-05-01 – 2019-05-02 (×2): 1 via ORAL
  Filled 2019-04-30 (×2): qty 1

## 2019-04-30 MED ORDER — OXYTOCIN 10 UNIT/ML IJ SOLN
INTRAMUSCULAR | Status: AC
  Administered 2019-04-30: 10 [IU] via INTRAMUSCULAR
  Filled 2019-04-30: qty 1

## 2019-04-30 MED ORDER — IBUPROFEN 600 MG PO TABS
600.0000 mg | ORAL_TABLET | Freq: Four times a day (QID) | ORAL | Status: DC
  Administered 2019-04-30 – 2019-05-02 (×8): 600 mg via ORAL
  Filled 2019-04-30 (×8): qty 1

## 2019-04-30 MED ORDER — SENNOSIDES-DOCUSATE SODIUM 8.6-50 MG PO TABS
2.0000 | ORAL_TABLET | ORAL | Status: DC
  Administered 2019-05-01 (×2): 2 via ORAL
  Filled 2019-04-30 (×2): qty 2

## 2019-04-30 MED ORDER — LACTATED RINGERS IV SOLN: 500.0000 mL | INTRAVENOUS | Status: DC | PRN

## 2019-04-30 MED ORDER — OXYTOCIN 10 UNIT/ML IJ SOLN
10.0000 [IU] | Freq: Once | INTRAMUSCULAR | Status: AC
  Administered 2019-04-30: 10 [IU] via INTRAMUSCULAR

## 2019-04-30 MED ORDER — LIDOCAINE HCL (PF) 1 % IJ SOLN: 30.0000 mL | INTRAMUSCULAR | Status: DC | PRN

## 2019-04-30 MED ORDER — WITCH HAZEL-GLYCERIN EX PADS: 1.0000 "application " | MEDICATED_PAD | CUTANEOUS | Status: DC | PRN

## 2019-04-30 MED ORDER — DIBUCAINE (PERIANAL) 1 % EX OINT: 1.0000 "application " | TOPICAL_OINTMENT | CUTANEOUS | Status: DC | PRN

## 2019-04-30 MED ORDER — ACETAMINOPHEN 325 MG PO TABS
650.0000 mg | ORAL_TABLET | ORAL | Status: DC | PRN
  Administered 2019-04-30 – 2019-05-01 (×3): 650 mg via ORAL
  Filled 2019-04-30 (×4): qty 2

## 2019-04-30 MED ORDER — COCONUT OIL OIL
1.0000 "application " | TOPICAL_OIL | Status: DC | PRN
  Administered 2019-04-30: 1 via TOPICAL

## 2019-04-30 MED ORDER — DIPHENHYDRAMINE HCL 25 MG PO CAPS: 25.0000 mg | ORAL_CAPSULE | Freq: Four times a day (QID) | ORAL | Status: DC | PRN

## 2019-04-30 MED ORDER — ACETAMINOPHEN 325 MG PO TABS: 650.0000 mg | ORAL_TABLET | ORAL | Status: DC | PRN

## 2019-04-30 MED ORDER — TETANUS-DIPHTH-ACELL PERTUSSIS 5-2.5-18.5 LF-MCG/0.5 IM SUSP: 0.5000 mL | Freq: Once | INTRAMUSCULAR | Status: DC

## 2019-04-30 MED ORDER — SIMETHICONE 80 MG PO CHEW: 80.0000 mg | CHEWABLE_TABLET | ORAL | Status: DC | PRN

## 2019-04-30 MED ORDER — LACTATED RINGERS IV SOLN
INTRAVENOUS | Status: DC
  Administered 2019-04-30: 15:00:00 via INTRAVENOUS

## 2019-04-30 MED ORDER — ONDANSETRON HCL 4 MG/2ML IJ SOLN: 4.0000 mg | INTRAMUSCULAR | Status: DC | PRN

## 2019-04-30 MED ORDER — OXYCODONE-ACETAMINOPHEN 5-325 MG PO TABS: 2.0000 | ORAL_TABLET | ORAL | Status: DC | PRN

## 2019-04-30 MED ORDER — OXYCODONE-ACETAMINOPHEN 5-325 MG PO TABS: 1.0000 | ORAL_TABLET | ORAL | Status: DC | PRN

## 2019-04-30 MED ORDER — ONDANSETRON HCL 4 MG PO TABS: 4.0000 mg | ORAL_TABLET | ORAL | Status: DC | PRN

## 2019-04-30 MED ORDER — OXYTOCIN 40 UNITS IN NORMAL SALINE INFUSION - SIMPLE MED: 2.5000 [IU]/h | INTRAVENOUS | Status: DC

## 2019-04-30 MED ORDER — SOD CITRATE-CITRIC ACID 500-334 MG/5ML PO SOLN: 30.0000 mL | ORAL | Status: DC | PRN

## 2019-04-30 MED ORDER — ZOLPIDEM TARTRATE 5 MG PO TABS: 5.0000 mg | ORAL_TABLET | Freq: Every evening | ORAL | Status: DC | PRN

## 2019-04-30 NOTE — Discharge Summary (Signed)
OB Discharge Summary     Patient Name: Patricia Vaughn DOB: 1991-10-30 MRN: [id number]  Date of admission: 04/30/2019 Delivering MD: Arvilla MarketWALLACE, CATHERINE LAUREN   Date of discharge: 05/02/2019  Admitting diagnosis: Active Labor 1 min apart Intrauterine pregnancy: 6577w4d     Secondary diagnosis:  Active Problems:   Chlamydia infection during pregnancy   Labor and delivery, indication for care   SVD (spontaneous vaginal delivery)  Additional problems: none     Discharge diagnosis: Term Pregnancy Delivered                                                                                                Post partum procedures:none  Augmentation: None  Complications: None  Hospital course:  Onset of Labor With Vaginal Delivery     28 y.o. yo [id number] at 2277w4d was admitted in Active Labor on 04/30/2019. Patient had an uncomplicated precipitous labor course as follows:  Membrane Rupture Time/Date: 1:15 PM ,04/30/2019   Intrapartum Procedures: Episiotomy: None [1]                                         Lacerations:  1st degree [2]  Patient had a delivery of a Viable infant. 04/30/2019  Information for the patient's newborn:  Mariana ArnLopez, Boy Emmarae [[id number]][[id number]]  Delivery Method: Vaginal, Spontaneous(Filed from Delivery Summary)    Pateint had an uncomplicated postpartum course.  She is ambulating, tolerating a regular diet, passing flatus, and urinating well. Patient is discharged home in stable condition on 05/02/19.   Physical exam  Vitals:   05/01/19 1406 05/01/19 1417 05/01/19 2212 05/02/19 0519  BP: (!) 124/108 108/78 115/80 106/68  Pulse: 89 91 67 75  Resp: 16  18 16   Temp: 98.5 F (36.9 C)  98.2 F (36.8 C) 98 F (36.7 C)  TempSrc: Oral   Oral  SpO2:   100% 99%  Weight:      Height:       General: alert and cooperative Lochia: appropriate Uterine Fundus: firm Incision: N/A DVT Evaluation: No evidence of DVT seen on physical exam. Labs: Lab Results  Component Value Date   WBC  16.5 (H) 04/30/2019   HGB 11.4 (L) 04/30/2019   HCT 37.9 04/30/2019   MCV 79.1 (L) 04/30/2019   PLT 138 (L) 04/30/2019   CMP Latest Ref Rng & Units 10/10/2018  Glucose 70 - 99 mg/dL 96  BUN 6 - 20 mg/dL 9  Creatinine 1.300.44 - 8.651.00 mg/dL 7.840.50  Sodium 696135 - 295145 mmol/L 135  Potassium 3.5 - 5.1 mmol/L 3.9  Chloride 98 - 111 mmol/L 104  CO2 22 - 32 mmol/L 23  Calcium 8.9 - 10.3 mg/dL 9.0  Total Protein 6.5 - 8.1 g/dL -  Total Bilirubin 0.3 - 1.2 mg/dL -  Alkaline Phos 38 - 284126 U/L -  AST 15 - 41 U/L -  ALT 14 - 54 U/L -    Discharge instruction: per After Visit Summary and "Baby and Me Booklet".  After visit  meds:  Allergies as of 05/02/2019   No Known Allergies     Medication List    STOP taking these medications   omeprazole 20 MG capsule Commonly known as:  PRILOSEC     TAKE these medications   ibuprofen 600 MG tablet Commonly known as:  ADVIL Take 1 tablet (600 mg total) by mouth every 6 (six) hours as needed.   prenatal vitamin w/FE, FA 27-1 MG Tabs tablet Take 1 tablet by mouth daily at 12 noon.       Diet: routine diet  Activity: Advance as tolerated. Pelvic rest for 6 weeks.   Outpatient follow up:4 weeks Follow up Appt: Future Appointments  Date Time Provider Department Center  06/05/2019 10:30 AM Cheral Marker, CNM [facility]   Follow up Visit:No follow-ups on file.    Please schedule this patient for Postpartum visit in: 4 weeks with the following provider: Any provider For C/S patients schedule nurse incision check in weeks 2 weeks: no Low risk pregnancy complicated by: +chlamydia with negative TOC  Delivery mode:  SVD Anticipated Birth Control:  Undecided  PP Procedures needed: None   Schedule Integrated BH visit: no  Postpartum contraception: Undecided  Newborn Data: Live born female  Birth Weight:  4040gm (8lb 14.5oz) APGAR: 9 , 9  Newborn Delivery   Birth date/time:  04/30/2019 13:59:00 Delivery type:  Vaginal, Spontaneous      Baby Feeding: Bottle and Breast Disposition:home with mother   05/02/2019 Arabella Merles, CNM  11:27 AM

## 2019-04-30 NOTE — ED Notes (Signed)
[facility] 142 , OB team in room to check mom , Pt transported L and D with team

## 2019-04-30 NOTE — ED Triage Notes (Signed)
Pt here from home pregnant with her 3rd child due date this coming sat. Pt's water broke on the way to the hospital , pt due date was this sat

## 2019-04-30 NOTE — H&P (Signed)
LABOR AND DELIVERY ADMISSION HISTORY AND PHYSICAL NOTE  Patricia Vaughn is a 28 y.o. female [phone] with IUP at [redacted]w[redacted]d by 8 week sono presenting for SOL. Patient presented to [facility] with complete cervical dilation, transferred to L&D where she had a precipitous delivery. SROM 20 minutes prior to arrival at the hospital. Contractions started yesterday but became strong and regular around 10 this morning.  She reports positive fetal movement. She denies vaginal bleeding.  Prenatal History/Complications: PNC at [facility] established at 11 weeks  Pregnancy complications:  - Chlamydia+ oin ED on 10/17, treated 10/25 and partner treated; negative TOC   Past Medical History: Past Medical History:  Diagnosis Date  . Medical history non-contributory   . Miscarriage, threatened, early pregnancy 03/28/2013  . Nausea 11/06/2013  . Supervision of low-risk pregnancy 11/06/2013    Past Surgical History: Past Surgical History:  Procedure Laterality Date  . NO PAST SURGERIES      Obstetrical History: OB History    Gravida  5   Para  2   Term  2   Preterm  0   AB  2   Living  2     SAB  2   TAB  0   Ectopic  0   Multiple  0   Live Births  2           Social History: Social History   Socioeconomic History  . Marital status: Single    Spouse name: Ileana Roup  . Number of children: 2  . Years of education: 88  . Highest education level: High school graduate  Occupational History  . Not on file  Social Needs  . Financial resource strain: Not very hard  . Food insecurity:    Worry: Sometimes true    Inability: Never true  . Transportation needs:    Medical: No    Non-medical: No  Tobacco Use  . Smoking status: Former Games developer  . Smokeless tobacco: Never Used  Substance and Sexual Activity  . Alcohol use: No  . Drug use: No  . Sexual activity: Yes    Birth control/protection: None  Lifestyle  . Physical activity:    Days per week: 0 days    Minutes per session: 0 min  .  Stress: Rather much  Relationships  . Social connections:    Talks on phone: More than three times a week    Gets together: Three times a week    Attends religious service: Never    Active member of club or organization: No    Attends meetings of clubs or organizations: Never    Relationship status: Divorced  Other Topics Concern  . Not on file  Social History Narrative  . Not on file    Family History: Family History  Problem Relation Age of Onset  . Hyperlipidemia Father   . Cancer Maternal Grandfather   . Diabetes Other   . Hypertension Other   . Cancer Maternal Aunt     Allergies: No Known Allergies  Medications Prior to Admission  Medication Sig Dispense Refill Last Dose  . omeprazole (PRILOSEC) 20 MG capsule Take 1 capsule (20 mg total) by mouth daily. 1 tablet a day 30 capsule 6 Taking  . prenatal vitamin w/FE, FA (PRENATAL 1 + 1) 27-1 MG TABS tablet Take 1 tablet by mouth daily at 12 noon. 30 each 12 Taking     Review of Systems  All systems reviewed and negative except as stated in HPI  Physical Exam Blood pressure 127/69, pulse 83, temperature 97.8 F (36.6 C), resp. rate (!) 22, height 5\' 7"  (1.702 m), weight 67.1 kg, last menstrual period 08/04/2018, SpO2 98 %. General appearance: alert, oriented, NAD, breathing through contractions  Lungs: normal respiratory effort Heart: regular rate Abdomen: soft, non-tender; gravid, FH appropriate for GA Extremities: No calf swelling or tenderness Presentation: cephalic and complete      Prenatal labs: ABO, Rh: --/--/B POS Performed at [facility], [address] [place], [place] [address]  [phone](10/17 2137) Antibody: Negative (02/20 0909) Rubella: 1.46 (10/21 1055) RPR: Non Reactive (02/20 0909)  HBsAg: Negative (10/21 1055)  HIV: Non Reactive (02/20 0909)  GC/Chlamydia: Negative  GBS:   Negative  2-hr GTT: Normal  Genetic screening:  Negative  Anatomy US: Normal   Prenatal Transfer Tool  Maternal  Diabetes: No Genetic Screening: Normal Maternal Ultrasounds/Referrals: Normal Fetal Ultrasounds or other Referrals:  None Maternal Substance Abuse:  No Significant Maternal Medications:  None Significant Maternal Lab Results: Lab values include: Group B Strep negative  No results found for this or any previous visit (from the past 24 hour(s)).  Patient Active Problem List   Diagnosis Date Noted  . Labor and delivery, indication for care 04/30/2019  . Supervision of normal pregnancy 10/14/2018    Assessment/Plan: Patricia NianMaira L Vaughn is a 28 y.o. [id number] at 2310w4d here for SOL. Patient complete at time of arrival to birthing suites. Precipitous delivery. Patient had baby boy, undecided about circumcision. Patient plans to breastfeed. Undecided about contraception.   De HollingsheadCatherine L Kyandre Okray 04/30/2019, 2:31 PM

## 2019-04-30 NOTE — ED Provider Notes (Signed)
[facility] Emergency Department Provider Note MRN:  [id number]  Arrival date & time: 04/30/19     Chief Complaint   Active labor History of Present Illness   Patricia Vaughn is a 28 y.o. year-old female with no pertinent past medical history presenting to the ED with chief complaint of active labor.  This is patient's third pregnancy, she is [redacted] weeks pregnant with a due date 3 days from now.  Shortly prior to arrival, began having contractions as well as leakage of fluid interpreted as her water breaking.  Estimates that her contractions are occurring every 1 minute.  Denies vaginal bleeding.  No issues with current pregnancy, has been getting prenatal care.  Contraction pain is 10 out of 10, intermittent, cramping in nature.  Review of Systems  A complete 10 system review of systems was obtained and all systems are negative except as noted in the HPI and PMH.   Patient's Health History    Past Medical History:  Diagnosis Date  . Medical history non-contributory   . Miscarriage, threatened, early pregnancy 03/28/2013  . Nausea 11/06/2013  . Supervision of low-risk pregnancy 11/06/2013    Past Surgical History:  Procedure Laterality Date  . NO PAST SURGERIES      Family History  Problem Relation Age of Onset  . Hyperlipidemia Father   . Cancer Maternal Grandfather   . Diabetes Other   . Hypertension Other   . Cancer Maternal Aunt     Social History   Socioeconomic History  . Marital status: Single    Spouse name: Ileana Roup  . Number of children: 2  . Years of education: 51  . Highest education level: High school graduate  Occupational History  . Not on file  Social Needs  . Financial resource strain: Not very hard  . Food insecurity:    Worry: Sometimes true    Inability: Never true  . Transportation needs:    Medical: No    Non-medical: No  Tobacco Use  . Smoking status: Former Games developer  . Smokeless tobacco: Never Used  Substance and Sexual  Activity  . Alcohol use: No  . Drug use: No  . Sexual activity: Yes    Birth control/protection: None  Lifestyle  . Physical activity:    Days per week: 0 days    Minutes per session: 0 min  . Stress: Rather much  Relationships  . Social connections:    Talks on phone: More than three times a week    Gets together: Three times a week    Attends religious service: Never    Active member of club or organization: No    Attends meetings of clubs or organizations: Never    Relationship status: Divorced  . Intimate partner violence:    Fear of current or ex partner: No    Emotionally abused: No    Physically abused: No    Forced sexual activity: No  Other Topics Concern  . Not on file  Social History Narrative  . Not on file     Physical Exam  Vital Signs and Nursing Notes reviewed Vitals:   04/30/19 1341  BP: 127/69  Pulse: 83  Resp: (!) 22  Temp: 97.8 F (36.6 C)  SpO2: 98%    CONSTITUTIONAL: Well-appearing, NAD NEURO:  Alert and oriented x 3, no focal deficits EYES:  eyes equal and reactive ENT/NECK:  no LAD, no JVD CARDIO: Regular rate, well-perfused, normal S1 and S2 PULM:  CTAB  no wheezing or rhonchi GI/GU: Gravid; no evidence of crowning on initial genital exam, baby's head is easily palpable, estimated 8 cm dilated, near complete effacement, position +1 MSK/SPINE:  No gross deformities, no edema SKIN:  no rash, atraumatic PSYCH:  Appropriate speech and behavior  Diagnostic and Interventional Summary    Labs Reviewed - No data to display  No orders to display    Medications - No data to display   Procedures Critical Care Critical Care Documentation Critical care time provided by me (excluding procedures): 31 minutes  Condition necessitating critical care: Active labor, imminent delivery  Components of critical care management: reviewing of prior records, laboratory and imaging interpretation, frequent re-examination and reassessment of vital signs,  discussion with consulting services    ED Course and Medical Decision Making  I have reviewed the triage vital signs and the nursing notes.  Pertinent labs & imaging results that were available during my care of the patient were reviewed by me and considered in my medical decision making (see below for details).  Active labor in this otherwise healthy 28 year old female, third pregnancy, on my exam patient is near complete.  We are thankful for our OB/GYN colleagues who are quick to evaluate, patient transported to women's center for delivery.  Elmer SowMichael M. Pilar PlateBero, MD [email or web address] [email or web address]  Final Clinical Impressions(s) / ED Diagnoses     ICD-10-CM   1. Pain during labor O99.89    R52     ED Discharge Orders    None         Sabas SousBero, Teairra Millar M, MD 04/30/19 1356

## 2019-05-01 LAB — RPR: RPR Ser Ql: NONREACTIVE

## 2019-05-01 MED ORDER — OXYCODONE-ACETAMINOPHEN 5-325 MG PO TABS
1.0000 | ORAL_TABLET | Freq: Once | ORAL | Status: AC
  Administered 2019-05-01: 1 via ORAL
  Filled 2019-05-01: qty 1

## 2019-05-01 NOTE — Lactation Note (Signed)
This note was copied from a baby's chart. Lactation Consultation Note  Patient Name: Patricia Vaughn Date: 05/01/2019 Reason for consult: Initial assessment;Term;Nipple pain/trauma  Visited with P3 Mom of term baby at 42 hrs old.  Mom choosing to formula and breastfeed, as she stated she had low milk supply with her first 2 babies.  When discussed, she introduced formula early in the hospital with them as well.  But Mom states she tried pumping also with her first 2 babies.  This time, she will breastfeed and follow with formula.  Mom aware of supply meets demand, and encouraged to offer breast first.   Mom complaining of soreness to her left nipple.  Mom has small breasts, with erect nipples.  Left side positional stripe with crack on center.  Mom choosing not to latch baby on that side.  Hand pump given with instructions on use to stimulate her breast. Milder looking positional stripe on right breast.  Offered to assist with positioning and latching as baby started cueing.    Mom instructed to support and shape her breast using a U hold at base of breast.  Colostrum easily expressed.  Baby opens wide and latches easily.  Lower lip tucked, and showed Mom how to tug on chin to uncurl lower lip and widen the jaw.  Baby fed well and swallows identified.    If Mom choosing to formula feed, demonstrated paced bottle to mimic milk flow from the breast.   Mom very receptive to teaching.  Plan- 1- Keep baby STS as much as possible 2- Offer breast with any cue, goal of >8 feedings in 24 hrs 3- using hand pump, pump left side and use colostrum on nipple to aid in healing, also Mom has coconut oil to use. 4- ask for help prn.  Lactation brochure left in room.  Mom aware of IP and OP lactation support available to her.    Maternal Data Formula Feeding for Exclusion: Yes Reason for exclusion: Mother's choice to formula and breast feed on admission Has patient been taught Hand Expression?:  Yes Does the patient have breastfeeding experience prior to this delivery?: Yes  Feeding Feeding Type: Breast Fed  LATCH Score Latch: Grasps breast easily, tongue down, lips flanged, rhythmical sucking.  Audible Swallowing: A few with stimulation  Type of Nipple: Everted at rest and after stimulation  Comfort (Breast/Nipple): Filling, red/small blisters or bruises, mild/mod discomfort  Hold (Positioning): Assistance needed to correctly position infant at breast and maintain latch.  LATCH Score: 7  Interventions Interventions: Breast feeding basics reviewed;Assisted with latch;Skin to skin;Breast massage;Hand express;Breast compression;Adjust position;Support pillows;Expressed milk;Position options;Hand pump  Lactation Tools Discussed/Used Tools: Pump Breast pump type: Manual Pump Review: Setup, frequency, and cleaning;Milk Storage Initiated by:: Erby Pian RN IBCLC Date initiated:: 05/01/19   Consult Status Consult Status: Follow-up Date: 05/02/19 Follow-up type: In-patient    Judee Clara 05/01/2019, 12:26 PM

## 2019-05-01 NOTE — Progress Notes (Signed)
Post Partum Day 1 Subjective: no complaints, voiding and tolerating PO,  She reports some pelvic pain, taking IBU and Tylenol with "a little" help. She had a lot of the same pelvic pain for about 2 months prior to delivery.  Objective: Blood pressure 104/68, pulse 84, temperature 97.6 F (36.4 C), temperature source Oral, resp. rate 18, height 5\' 7"  (1.702 m), weight 67.1 kg, last menstrual period 08/04/2018, SpO2 99 %, unknown if currently breastfeeding.  Physical Exam:  General: alert Lochia: appropriate Uterine Fundus: firm DVT Evaluation: No evidence of DVT seen on physical exam.  Recent Labs    04/30/19 1434  HGB 11.4*  HCT 37.9    Assessment/Plan: Plan for discharge tomorrow   LOS: 1 day   Patricia Vaughn 05/01/2019, 9:34 AM

## 2019-05-02 MED ORDER — IBUPROFEN 600 MG PO TABS: 600.0000 mg | ORAL_TABLET | Freq: Four times a day (QID) | ORAL | 0 refills | Status: DC | PRN

## 2019-05-02 NOTE — Progress Notes (Signed)
Patient reports the abdominal binder which she has applied around her hips, is being her support and providing comfort with her pelvic pain. She reports being able to ambulate in room. Husband is supportive with newborn care.

## 2019-05-02 NOTE — Lactation Note (Addendum)
This note was copied from a baby's chart. Lactation Consultation Note Baby 29 hrs old. Mom hopes to go home today, pending on bili check. Baby was boarder line yesterday. Baby had large wt. Loss the first day. Mom has hx of low milk supply. Mom doesn't want to use DEBP. Prefers just to use hand pump. Encouraged to pump every three hours for stimulation, then hand express afterwards. Discussed supplementing baby after BF d/t bili level and wt. Loss. Explained to mom may use BM or formula. Mom has formula in room available.  Reviewed supply and demand. Mom states she is aware. Discussed importance of strict I&O even after d/c home. Discussed breast filling, assessing for transfer of milk after feeding, breast massage, engorgement management and milk storage. Mom stated she didn't have engorgement with her other 2 children.  Comfort gels given. Lt. Nipple cracked, Rt. Nipple positional stripe. Encouraged mom to call for next feeding. Small breast tight, not compressible, everted nipple. Baby is apparently just on the nipple d/t unable to obtain any breast tissue. Mom has just small areola ring around nipple. Mom stated baby sleeping, had just finished BF. Reminded of support group and Lactation services. Mom is contacting WIC.  Patient Name: Patricia Vaughn JSHFW'Y Date: 05/02/2019 Reason for consult: Follow-up assessment   Maternal Data    Feeding    LATCH Score       Type of Nipple: Everted at rest and after stimulation  Comfort (Breast/Nipple): Filling, red/small blisters or bruises, mild/mod discomfort        Interventions Interventions: Comfort gels  Lactation Tools Discussed/Used Tools: Comfort gels   Consult Status Consult Status: Complete Date: 05/02/19    Charyl Dancer 05/02/2019, 2:51 AM

## 2019-05-02 NOTE — Discharge Instructions (Signed)
Vaginal Delivery, Care After °Refer to this sheet in the next few weeks. These instructions provide you with information about caring for yourself after vaginal delivery. Your health care provider may also give you more specific instructions. Your treatment has been planned according to current medical practices, but problems sometimes occur. Call your health care provider if you have any problems or questions. °What can I expect after the procedure? °After vaginal delivery, it is common to have: °· Some bleeding from your vagina. °· Soreness in your abdomen, your vagina, and the area of skin between your vaginal opening and your anus (perineum). °· Pelvic cramps. °· Fatigue. °Follow these instructions at home: °Medicines °· Take over-the-counter and prescription medicines only as told by your health care provider. °· If you were prescribed an antibiotic medicine, take it as told by your health care provider. Do not stop taking the antibiotic until it is finished. °Driving ° °· Do not drive or operate heavy machinery while taking prescription pain medicine. °· Do not drive for 24 hours if you received a sedative. °Lifestyle °· Do not drink alcohol. This is especially important if you are breastfeeding or taking medicine to relieve pain. °· Do not use tobacco products, including cigarettes, chewing tobacco, or e-cigarettes. If you need help quitting, ask your health care provider. °Eating and drinking °· Drink at least 8 eight-ounce glasses of water every day unless you are told not to by your health care provider. If you choose to breastfeed your baby, you may need to drink more water than this. °· Eat high-fiber foods every day. These foods may help prevent or relieve constipation. High-fiber foods include: °? Whole grain cereals and breads. °? Brown rice. °? Beans. °? Fresh fruits and vegetables. °Activity °· Return to your normal activities as told by your health care provider. Ask your health care provider what  activities are safe for you. °· Rest as much as possible. Try to rest or take a nap when your baby is sleeping. °· Do not lift anything that is heavier than your baby or 10 lb (4.5 kg) until your health care provider says that it is safe. °· Talk with your health care provider about when you can engage in sexual activity. This may depend on your: °? Risk of infection. °? Rate of healing. °? Comfort and desire to engage in sexual activity. °Vaginal Care °· If you have an episiotomy or a vaginal tear, check the area every day for signs of infection. Check for: °? More redness, swelling, or pain. °? More fluid or blood. °? Warmth. °? Pus or a bad smell. °· Do not use tampons or douches until your health care provider says this is safe. °· Watch for any blood clots that may pass from your vagina. These may look like clumps of dark red, brown, or black discharge. °General instructions °· Keep your perineum clean and dry as told by your health care provider. °· Wear loose, comfortable clothing. °· Wipe from front to back when you use the toilet. °· Ask your health care provider if you can shower or take a bath. If you had an episiotomy or a perineal tear during labor and delivery, your health care provider may tell you not to take baths for a certain length of time. °· Wear a bra that supports your breasts and fits you well. °· If possible, have someone help you with household activities and help care for your baby for at least a few days after you   leave the hospital. °· Keep all follow-up visits for you and your baby as told by your health care provider. This is important. °Contact a health care provider if: °· You have: °? Vaginal discharge that has a bad smell. °? Difficulty urinating. °? Pain when urinating. °? A sudden increase or decrease in the frequency of your bowel movements. °? More redness, swelling, or pain around your episiotomy or vaginal tear. °? More fluid or blood coming from your episiotomy or vaginal  tear. °? Pus or a bad smell coming from your episiotomy or vaginal tear. °? A fever. °? A rash. °? Little or no interest in activities you used to enjoy. °? Questions about caring for yourself or your baby. °· Your episiotomy or vaginal tear feels warm to the touch. °· Your episiotomy or vaginal tear is separating or does not appear to be healing. °· Your breasts are painful, hard, or turn red. °· You feel unusually sad or worried. °· You feel nauseous or you vomit. °· You pass large blood clots from your vagina. If you pass a blood clot from your vagina, save it to show to your health care provider. Do not flush blood clots down the toilet without having your health care provider look at them. °· You urinate more than usual. °· You are dizzy or light-headed. °· You have not breastfed at all and you have not had a menstrual period for 12 weeks after delivery. °· You have stopped breastfeeding and you have not had a menstrual period for 12 weeks after you stopped breastfeeding. °Get help right away if: °· You have: °? Pain that does not go away or does not get better with medicine. °? Chest pain. °? Difficulty breathing. °? Blurred vision or spots in your vision. °? Thoughts about hurting yourself or your baby. °· You develop pain in your abdomen or in one of your legs. °· You develop a severe headache. °· You faint. °· You bleed from your vagina so much that you fill two sanitary pads in one hour. °This information is not intended to replace advice given to you by your health care provider. Make sure you discuss any questions you have with your health care provider. °Document Released: 12/08/2000 Document Revised: 05/24/2016 Document Reviewed: 12/26/2015 °Elsevier Interactive Patient Education © 2019 [employer] ° °

## 2019-05-02 NOTE — Lactation Note (Signed)
This note was copied from a baby's chart. Lactation Consultation Note  Patient Name: Boy Lelar Grygiel [id number] Date: 05/02/2019 Reason for consult: Follow-up assessment;Infant weight loss;Nipple pain/trauma Baby is 43 hours old/10% weight loss.  Mom states she has decided to formula feed.  Discussed the option to put baby to breast first and formula feed after.  Mom declined assist this morning.  Encouraged to call for assist/concerns.  Maternal Data    Feeding    LATCH Score                   Interventions    Lactation Tools Discussed/Used     Consult Status Consult Status: Complete Follow-up type: Call as needed    Huston Foley 05/02/2019, 9:06 AM

## 2019-05-05 ENCOUNTER — Other Ambulatory Visit: Payer: Medicaid Other | Admitting: Obstetrics & Gynecology

## 2019-06-04 ENCOUNTER — Encounter: Payer: Self-pay | Admitting: *Deleted

## 2019-06-05 ENCOUNTER — Encounter: Payer: Self-pay | Admitting: Adult Health

## 2019-06-05 ENCOUNTER — Ambulatory Visit (INDEPENDENT_AMBULATORY_CARE_PROVIDER_SITE_OTHER): Payer: Medicaid Other | Admitting: Adult Health

## 2019-06-05 ENCOUNTER — Other Ambulatory Visit: Payer: Self-pay

## 2019-06-05 DIAGNOSIS — Z30011 Encounter for initial prescription of contraceptive pills: Secondary | ICD-10-CM

## 2019-06-05 DIAGNOSIS — Z1331 Encounter for screening for depression: Secondary | ICD-10-CM

## 2019-06-05 MED ORDER — LO LOESTRIN FE 1 MG-10 MCG / 10 MCG PO TABS: 1.0000 | ORAL_TABLET | Freq: Every day | ORAL | 11 refills | Status: DC

## 2019-06-05 NOTE — Progress Notes (Signed)
Patient ID: Patricia Vaughn, female   DOB: Dec 09, 1991, 28 y.o.   MRN: [id number]    TELEHEALTH VIRTUAL POSTPARTUM VISIT ENCOUNTER NOTE  I connected with@ on 06/05/19 at 10:30 AM EDT by telephone at home and verified that I am speaking with the correct person using two identifiers.   I discussed the limitations, risks, security and privacy concerns of performing an evaluation and management service by telephone and the availability of in person appointments. I also discussed with the patient that there may be a patient responsible charge related to this service. The patient expressed understanding and agreed to proceed.  Appointment Date: 06/05/2019  [facility]   Chief Complaint:  Chief Complaint  Patient presents with  . postpartum visit    interested in birth control pills    History of Present Illness: Patricia NianMaira L Labelle is a 28 y.o. Hispanic [id number] (Patient's last menstrual period was 06/03/2019.), seen for the above chief complaint.   She is s/p normal spontaneous vaginal delivery on 04/30/2019 at 34+4 weeks; she was discharged to home on 05/02/2019.  Baby is doing well, his name is Peggye FothergillJuan Anthony.  No complaints.  Vaginal bleeding or discharge: Yes  spotting Mode of feeding infant: Bottle, now did breat feed for 2 weeks  Intercourse: Yes  Contraception: condoms, buts wants to get on OCs, she said she lost weight when on depo and turned yellow and it resolved when she stopped it  PP depression s/s: No .  Any bowel or bladder issues: No  Pap smear: no abnormalities (date: 11/11/18)  Review of Systems:Patient denies any headaches, hearing loss, fatigue, blurred vision, shortness of breath, chest pain, abdominal pain, problems with bowel movements, urination, or intercourse. No joint pain or mood swings.. Her 12 point review of systems is negative or as noted in the History of Present Illness.  Patient Active Problem List   Diagnosis Date Noted  . Labor and delivery,  indication for care 04/30/2019  . SVD (spontaneous vaginal delivery) 04/30/2019  . Chlamydia infection during pregnancy 10/18/2018  . Supervision of normal pregnancy 10/14/2018    Medications Trena L. Shawnie DapperLopez had no medications administered during this visit. Current Outpatient Medications  Medication Sig Dispense Refill  . prenatal vitamin w/FE, FA (PRENATAL 1 + 1) 27-1 MG TABS tablet Take 1 tablet by mouth daily at 12 noon. 30 each 12   No current facility-administered medications for this visit.     Allergies Patient has no known allergies.  Physical Exam:  General:  Alert, oriented and cooperative.   Mental Status: Normal mood and affect perceived. Normal judgment and thought content.  Rest of physical exam deferred due to type of encounter Ht 5\' 7"  (1.702 m)   LMP 06/03/2019   Breastfeeding No   BMI 23.18 kg/m per pt.  PP Depression Screening:   Edinburgh Postnatal Depression Scale - 06/05/19 1132      Edinburgh Postnatal Depression Scale:  In the Past 7 Days   I have been able to laugh and see the funny side of things.  0    I have looked forward with enjoyment to things.  0    I have blamed myself unnecessarily when things went wrong.  0    I have been anxious or worried for no good reason.  0    I have felt scared or panicky for no good reason.  0    Things have been getting on top of me.  0    I  have been so unhappy that I have had difficulty sleeping.  0    I have felt sad or miserable.  0    I have been so unhappy that I have been crying.  0    The thought of harming myself has occurred to me.  0    Edinburgh Postnatal Depression Scale Total  0       Assessment:Patient is a 29 y.o. [id number] who is 5 weeks postpartum from a SVD at 39+4 weeks on 04/30/2019. 1. Postpartum care following vaginal delivery   2. Depression screening negative   3. Encounter for initial prescription of contraceptive pills     Plan: Will rx lo loestrin to start this Sunday and use  condoms for at least 1 pack Meds ordered this encounter  Medications  . Norethindrone-Ethinyl Estradiol-Fe Biphas (LO LOESTRIN FE) 1 MG-10 MCG / 10 MCG tablet    Sig: Take 1 tablet by mouth daily. Take 1 daily by mouth    Dispense:  1 Package    Refill:  11    BIN [id number], PCN [id number], GRP [id number]    Order Specific Question:   Supervising Provider    Answer:   Tania Ade H [[id number]]   RTC in 1 year for physical or sooner if needed  I discussed the assessment and treatment plan with the patient. The patient was provided an opportunity to ask questions and all were answered. The patient agreed with the plan and demonstrated an understanding of the instructions.   The patient was advised to call back or seek an in-person evaluation/go to the ED for any concerning postpartum symptoms.  I provided 7 minutes of non-face-to-face time during this encounter.   Derrek Monaco, NP [facility], [place]

## 2019-08-12 ENCOUNTER — Telehealth: Payer: Self-pay | Admitting: Adult Health

## 2019-08-12 NOTE — Telephone Encounter (Signed)
Pt has missed 2 days of her birth control and is wanting to know how she should go about taking those missed pills.

## 2019-08-12 NOTE — Telephone Encounter (Signed)
Pt informed that she should take 2 pills today and 2 pills tomorrow. After that take as she normally does. She was instructed to use condoms for the next 7 days.

## 2019-09-28 ENCOUNTER — Emergency Department (HOSPITAL_COMMUNITY)
Admission: EM | Admit: 2019-09-28 | Discharge: 2019-09-28 | Disposition: A | Discharge status: Home / Self Care | Payer: Medicaid Other | Attending: Emergency Medicine | Admitting: Emergency Medicine

## 2019-09-28 ENCOUNTER — Emergency Department (HOSPITAL_COMMUNITY): Payer: Medicaid Other

## 2019-09-28 ENCOUNTER — Encounter (HOSPITAL_COMMUNITY): Payer: Self-pay

## 2019-09-28 ENCOUNTER — Other Ambulatory Visit: Payer: Self-pay

## 2019-09-28 DIAGNOSIS — N132 Hydronephrosis with renal and ureteral calculous obstruction: Secondary | ICD-10-CM | POA: Diagnosis not present

## 2019-09-28 DIAGNOSIS — Z87891 Personal history of nicotine dependence: Secondary | ICD-10-CM | POA: Diagnosis not present

## 2019-09-28 DIAGNOSIS — N200 Calculus of kidney: Secondary | ICD-10-CM | POA: Diagnosis not present

## 2019-09-28 DIAGNOSIS — M545 Low back pain: Secondary | ICD-10-CM | POA: Diagnosis present

## 2019-09-28 LAB — CBC WITH DIFFERENTIAL/PLATELET
Abs Immature Granulocytes: 0.04 10*3/uL (ref 0.00–0.07)
Basophils Absolute: 0 10*3/uL (ref 0.0–0.1)
Basophils Relative: 0 %
Eosinophils Absolute: 0.1 10*3/uL (ref 0.0–0.5)
Eosinophils Relative: 1 %
HCT: 38.7 % (ref 36.0–46.0)
Hemoglobin: 11.7 g/dL — ABNORMAL LOW (ref 12.0–15.0)
Immature Granulocytes: 0 %
Lymphocytes Relative: 14 %
Lymphs Abs: 1.4 10*3/uL (ref 0.7–4.0)
MCH: 25.2 pg — ABNORMAL LOW (ref 26.0–34.0)
MCHC: 30.2 g/dL (ref 30.0–36.0)
MCV: 83.4 fL (ref 80.0–100.0)
Monocytes Absolute: 0.4 10*3/uL (ref 0.1–1.0)
Monocytes Relative: 4 %
Neutro Abs: 7.8 10*3/uL — ABNORMAL HIGH (ref 1.7–7.7)
Neutrophils Relative %: 81 %
Platelets: 125 10*3/uL — ABNORMAL LOW (ref 150–400)
RBC: 4.64 MIL/uL (ref 3.87–5.11)
RDW: 15.1 % (ref 11.5–15.5)
WBC: 9.7 10*3/uL (ref 4.0–10.5)
nRBC: 0 % (ref 0.0–0.2)

## 2019-09-28 LAB — COMPREHENSIVE METABOLIC PANEL
ALT: 23 U/L (ref 0–44)
AST: 23 U/L (ref 15–41)
Albumin: 4 g/dL (ref 3.5–5.0)
Alkaline Phosphatase: 70 U/L (ref 38–126)
Anion gap: 8 (ref 5–15)
BUN: 9 mg/dL (ref 6–20)
CO2: 25 mmol/L (ref 22–32)
Calcium: 8.8 mg/dL — ABNORMAL LOW (ref 8.9–10.3)
Chloride: 106 mmol/L (ref 98–111)
Creatinine, Ser: 0.57 mg/dL (ref 0.44–1.00)
GFR calc Af Amer: >60 mL/min (ref >60)
GFR calc non Af Amer: >60 mL/min (ref >60)
Glucose, Bld: 125 mg/dL — ABNORMAL HIGH (ref 70–99)
Potassium: 3.7 mmol/L (ref 3.5–5.1)
Sodium: 139 mmol/L (ref 135–145)
Total Bilirubin: 1 mg/dL (ref 0.3–1.2)
Total Protein: 7.2 g/dL (ref 6.5–8.1)

## 2019-09-28 LAB — URINALYSIS, ROUTINE W REFLEX MICROSCOPIC
Bilirubin Urine: NEGATIVE
Glucose, UA: NEGATIVE mg/dL
Ketones, ur: 5 mg/dL — AB
Nitrite: NEGATIVE
Protein, ur: NEGATIVE mg/dL
RBC / HPF: >50 RBC/hpf — ABNORMAL HIGH (ref 0–5)
Specific Gravity, Urine: 1.008 (ref 1.005–1.030)
pH: 7 (ref 5.0–8.0)

## 2019-09-28 LAB — LIPASE, BLOOD: Lipase: 31 U/L (ref 11–51)

## 2019-09-28 LAB — PREGNANCY, URINE: Preg Test, Ur: NEGATIVE

## 2019-09-28 MED ORDER — OXYCODONE-ACETAMINOPHEN 5-325 MG PO TABS: 1.0000 | ORAL_TABLET | Freq: Four times a day (QID) | ORAL | 0 refills | Status: DC | PRN

## 2019-09-28 MED ORDER — KETOROLAC TROMETHAMINE 60 MG/2ML IM SOLN
30.0000 mg | Freq: Once | INTRAMUSCULAR | Status: AC
  Administered 2019-09-28: 13:00:00 30 mg via INTRAMUSCULAR
  Filled 2019-09-28: qty 2

## 2019-09-28 MED ORDER — IOHEXOL 300 MG/ML  SOLN
75.0000 mL | Freq: Once | INTRAMUSCULAR | Status: AC | PRN
  Administered 2019-09-28: 75 mL via INTRAVENOUS

## 2019-09-28 MED ORDER — ONDANSETRON HCL 4 MG/2ML IJ SOLN
4.0000 mg | Freq: Once | INTRAMUSCULAR | Status: AC
  Administered 2019-09-28: 13:00:00 4 mg via INTRAVENOUS
  Filled 2019-09-28: qty 2

## 2019-09-28 MED ORDER — IOHEXOL 300 MG/ML  SOLN
30.0000 mL | Freq: Once | INTRAMUSCULAR | Status: AC | PRN
  Administered 2019-09-28: 16:00:00 30 mL via ORAL

## 2019-09-28 MED ORDER — ONDANSETRON HCL 4 MG PO TABS: 4.0000 mg | ORAL_TABLET | Freq: Three times a day (TID) | ORAL | 0 refills | Status: DC | PRN

## 2019-09-28 NOTE — ED Provider Notes (Signed)
[facility] EMERGENCY DEPARTMENT Provider Note   CSN: [id number] Arrival date & time: 09/28/19  1203     History   Chief Complaint Chief Complaint  Patient presents with   Back Pain    HPI Patricia Vaughn is a 28 y.o. female.     The history is provided by the patient. No language interpreter was used.  Back Pain    28 year old female presenting for evaluation of back pain.  Patient report this morning she was laying on her right side in bed with her infant laying next to her.  She got up from the bed and was trying to shift her body over her child when she developed acute onset of pain to her right lower back.  She described pain as a sharp sensation radiates to her abdomen, which has since been persistent worsening with movement.  Pain is currently rates as 7 out of 10 when she stay in a certain position.  She did endorse some nausea and vomiting from the intensity of the pain earlier in the day.  Her husband was able to massage which provide some relief but with movement the pain intensified.  She does not complain of any fever chills but does endorse mild lightheadedness.  No chest pain or shortness of breath no dysuria.  She is currently on the last day of her menstruation.  She is currently not breast-feeding.  She does not have any history of kidney stones.  She does not report any loss of appetite.  She denies any recent injury.  She denies any pain down her legs or any focal numbness or weakness.  Past Medical History:  Diagnosis Date   Medical history non-contributory    Miscarriage, threatened, early pregnancy 03/28/2013   Nausea 11/06/2013   Supervision of low-risk pregnancy 11/06/2013    Patient Active Problem List   Diagnosis Date Noted   Labor and delivery, indication for care 04/30/2019   SVD (spontaneous vaginal delivery) 04/30/2019   Chlamydia infection during pregnancy 10/18/2018   Supervision of normal pregnancy 10/14/2018    Past Surgical History:    Procedure Laterality Date   NO PAST SURGERIES       OB History    Gravida  5   Para  3   Term  3   Preterm  0   AB  2   Living  3     SAB  2   TAB  0   Ectopic  0   Multiple  0   Live Births  3            Home Medications    Prior to Admission medications   Medication Sig Start Date End Date Taking? Authorizing Provider  Norethindrone-Ethinyl Estradiol-Fe Biphas (LO LOESTRIN FE) 1 MG-10 MCG / 10 MCG tablet Take 1 tablet by mouth daily. Take 1 daily by mouth 06/05/19   Adline Potter, NP  prenatal vitamin w/FE, FA (PRENATAL 1 + 1) 27-1 MG TABS tablet Take 1 tablet by mouth daily at 12 noon. 09/12/18   Adline Potter, NP    Family History Family History  Problem Relation Age of Onset   Hyperlipidemia Father    Cancer Maternal Grandfather    Diabetes Other    Hypertension Other    Cancer Maternal Aunt     Social History Social History   Tobacco Use   Smoking status: Former Smoker   Smokeless tobacco: Never Used  Substance Use Topics   Alcohol use:  No   Drug use: No     Allergies   Patient has no known allergies.   Review of Systems Review of Systems  Musculoskeletal: Positive for back pain.  All other systems reviewed and are negative.    Physical Exam Updated Vital Signs BP (!) 105/92 (BP Location: Right Arm)    Pulse 69    Temp 97.9 F (36.6 C) (Oral)    Resp 16    Ht 5\' 7"  (1.702 m)    Wt 49.9 kg    LMP 09/21/2019    SpO2 100%    Breastfeeding No    BMI 17.23 kg/m   Physical Exam Vitals signs and nursing note reviewed.  Constitutional:      General: She is not in acute distress.    Appearance: She is well-developed.     Comments: Patient appears mildly uncomfortable, laying in a fetal position.  HENT:     Head: Atraumatic.  Eyes:     Conjunctiva/sclera: Conjunctivae normal.  Neck:     Musculoskeletal: Neck supple.  Cardiovascular:     Rate and Rhythm: Normal rate and regular rhythm.  Abdominal:      Palpations: Abdomen is soft.     Tenderness: There is abdominal tenderness (Tenderness to palpation of right side of abdomen and right flank without guarding or rebound tenderness). There is no right CVA tenderness or left CVA tenderness.  Musculoskeletal:        General: Tenderness (Right para lumbar spinal muscle tenderness without significant midline spine tenderness crepitus or step-off.) present.  Skin:    Findings: No rash.  Neurological:     Mental Status: She is alert.      ED Treatments / Results  Labs (all labs ordered are listed, but only abnormal results are displayed) Labs Reviewed  CBC WITH DIFFERENTIAL/PLATELET - Abnormal; Notable for the following components:      Result Value   Hemoglobin 11.7 (*)    MCH 25.2 (*)    Platelets 125 (*)    Neutro Abs 7.8 (*)    All other components within normal limits  COMPREHENSIVE METABOLIC PANEL - Abnormal; Notable for the following components:   Glucose, Bld 125 (*)    Calcium 8.8 (*)    All other components within normal limits  URINALYSIS, ROUTINE W REFLEX MICROSCOPIC - Abnormal; Notable for the following components:   Hgb urine dipstick LARGE (*)    Ketones, ur 5 (*)    Leukocytes,Ua TRACE (*)    RBC / HPF >50 (*)    Bacteria, UA RARE (*)    All other components within normal limits  LIPASE, BLOOD  PREGNANCY, URINE    EKG None  Radiology Ct Abdomen Pelvis W Contrast  Result Date: 09/28/2019 CLINICAL DATA:  Abdominal pain. Clinical concern for appendicitis. EXAM: CT ABDOMEN AND PELVIS WITH CONTRAST TECHNIQUE: Multidetector CT imaging of the abdomen and pelvis was performed using the standard protocol following bolus administration of intravenous contrast. CONTRAST:  75mL OMNIPAQUE IOHEXOL 300 MG/ML SOLN, 30mL OMNIPAQUE IOHEXOL 300 MG/ML SOLN COMPARISON:  Pelvic ultrasound dated 01/24/2010. FINDINGS: Lower chest: Unremarkable. Hepatobiliary: Normal appearing gallbladder. Focal fat deposition in the liver adjacent to the  falciform ligament. Pancreas: Unremarkable. No pancreatic ductal dilatation or surrounding inflammatory changes. Spleen: Normal in size without focal abnormality. Adrenals/Urinary Tract: Moderate dilatation of the right renal collecting system to the level of a 5 mm calculus in the proximal ureter, just distal to the UPJ. Tiny lower pole right renal calculus. Unremarkable left  kidney, left ureter and urinary bladder. Normal appearing adrenal glands. Stomach/Bowel: Stomach is within normal limits. Appendix appears normal. No evidence of bowel wall thickening, distention, or inflammatory changes. Vascular/Lymphatic: No significant vascular findings are present. No enlarged abdominal or pelvic lymph nodes. Reproductive: Uterus and bilateral adnexa are unremarkable. Other: Tiny umbilical hernia containing fat. Musculoskeletal: Normal appearing bones. IMPRESSION: 1. 5 mm proximal right ureteral calculus causing moderate right hydronephrosis. 2. Tiny, nonobstructing lower pole right renal calculus. 3. Normal appendix. Electronically Signed   By: Claudie Revering M.D.   On: 09/28/2019 16:57    Procedures Procedures (including critical care time)  Medications Ordered in ED Medications  ketorolac (TORADOL) injection 30 mg (30 mg Intramuscular Given 09/28/19 1240)  ondansetron (ZOFRAN) injection 4 mg (4 mg Intravenous Given 09/28/19 1313)  iohexol (OMNIPAQUE) 300 MG/ML solution 30 mL (30 mLs Oral Contrast Given 09/28/19 1620)  iohexol (OMNIPAQUE) 300 MG/ML solution 75 mL (75 mLs Intravenous Contrast Given 09/28/19 1620)     Initial Impression / Assessment and Plan / ED Course  I have reviewed the triage vital signs and the nursing notes.  Pertinent labs & imaging results that were available during my care of the patient were reviewed by me and considered in my medical decision making (see chart for details).        BP (!) 105/92 (BP Location: Right Arm)    Pulse 69    Temp 97.9 F (36.6 C) (Oral)    Resp 16     Ht 5\' 7"  (1.702 m)    Wt 49.9 kg    LMP 09/21/2019    SpO2 100%    Breastfeeding No    BMI 17.23 kg/m    Final Clinical Impressions(s) / ED Diagnoses   Final diagnoses:  Kidney stone on right side    ED Discharge Orders         Ordered    oxyCODONE-acetaminophen (PERCOCET) 5-325 MG tablet  Every 6 hours PRN     09/28/19 1707    ondansetron (ZOFRAN) 4 MG tablet  Every 8 hours PRN     09/28/19 1707         12:32 PM Patient here with pain to her right lower back and abdomen after moving awkwardly earlier today.  Suspect musculoskeletal pain.  She is also on the last day for menstruation which I suspect her potential for ovarian cyst causing her pain. Given R CVA tenderness, will consider CT scan as work up.  I have low suspicion for ovarian torsion, appendicitis, or other acute medical emergency.  Will provide symptomatic treatment and will reassess.  5:05 PM Abdominal pelvis CT scan performed showing evidence of a 5 mm obstructive kidney stones to the right UPJ causing moderate right hydronephrosis.  Normal appendix.  Patient received Toradol and felt better.  UA without signs of urinary tract infection.  Her labs are reassuring, pregnancy test is negative.  Will provide appropriate treatment and will refer to urology for further care.  Return precaution discussed.   Domenic Moras, PA-C 09/28/19 1710    Milton Ferguson, MD 09/29/19 1510

## 2019-09-28 NOTE — ED Notes (Signed)
Pt verbalized understanding of no driving and to use caution within 4 hours of taking pain meds due to meds cause drowsiness.  Pt also notified that percocet can cause constipation and a stool softener or laxative may be needed to prevent or relieve constipation.

## 2019-09-28 NOTE — ED Triage Notes (Signed)
Pt reports that she was laying on her right side and rolled to left and sharp pain to right lower back and lower legs shaking

## 2019-09-28 NOTE — ED Notes (Signed)
Pt returned from CT °

## 2019-09-28 NOTE — ED Notes (Signed)
Patient transported to CT 

## 2019-09-28 NOTE — Discharge Instructions (Signed)
You have been diagnosed with having a 61mm kidney stone on the right side causing your pain.  Use a urine strainer when urinate to capture your stone.  Follow up with urology for further care.  Follow instruction below.

## 2020-08-01 IMAGING — CT CT ABD-PELV W/ CM
2 of 4 series · 16 of 46 positions shown, 18 images · IV contrast (omnipaque)
Comparison: Pelvic ultrasound dated 01/24/2010.

CLINICAL DATA: Abdominal pain. Clinical concern for appendicitis.

EXAM:
CT ABDOMEN AND PELVIS WITH CONTRAST
TECHNIQUE: Multidetector CT imaging of the abdomen and pelvis was performed
using the standard protocol following bolus administration of
intravenous contrast.
CONTRAST:  75mL OMNIPAQUE IOHEXOL 300 MG/ML SOLN, 30mL OMNIPAQUE
IOHEXOL 300 MG/ML SOLN

[Series 2: axial st · axial · 0.71mm/px · z∈[+803,+1238]mm · 13 of 99 slices shown, 15 images]
[im 6/99  soft-tissue]
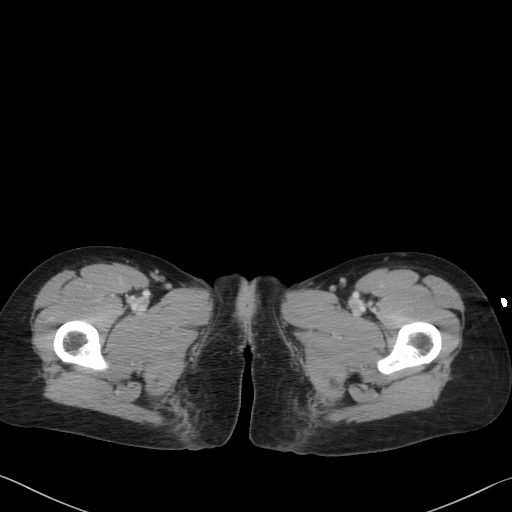
[im 6/99  bone]
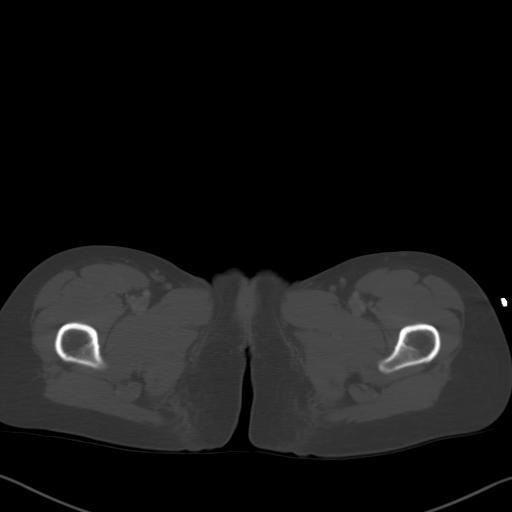
[im 11/99  soft-tissue]
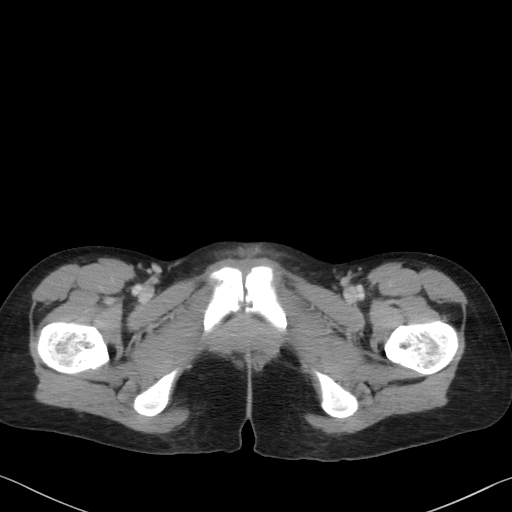
[im 22/99  soft-tissue]
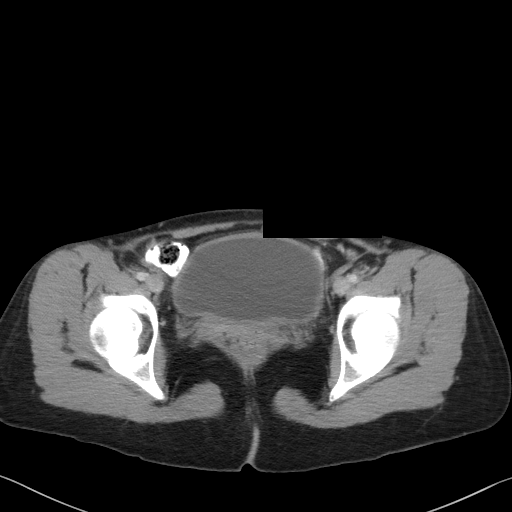
[im 28/99  soft-tissue]
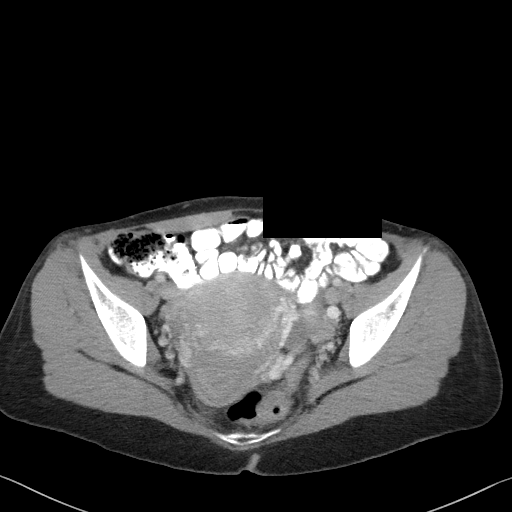
[im 33/99  soft-tissue]
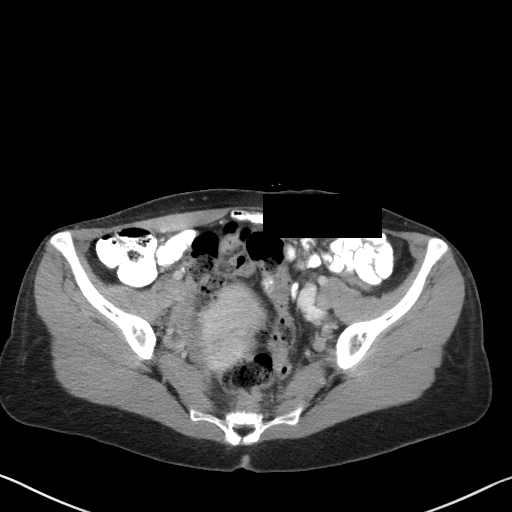
[im 44/99  soft-tissue]
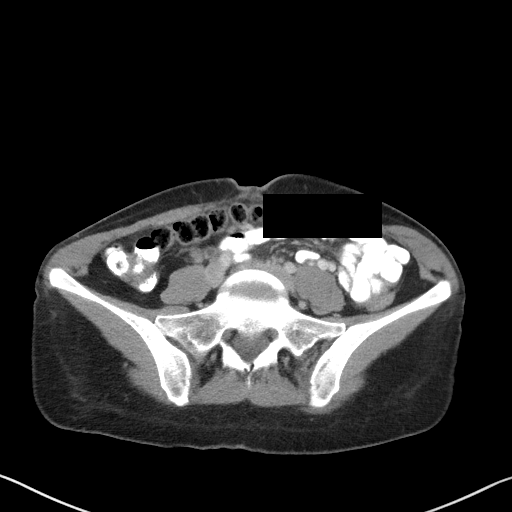
[im 50/99  soft-tissue]
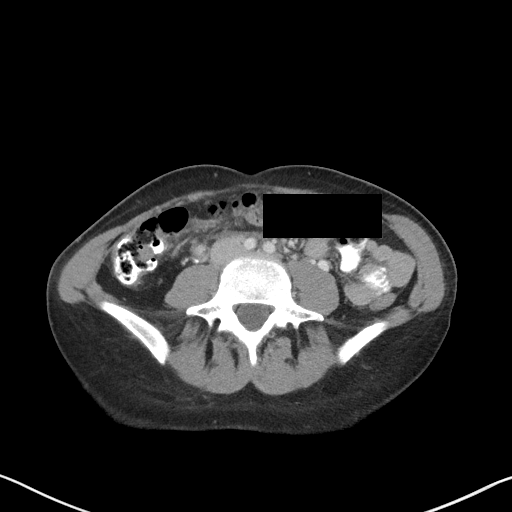
[im 55/99  soft-tissue]
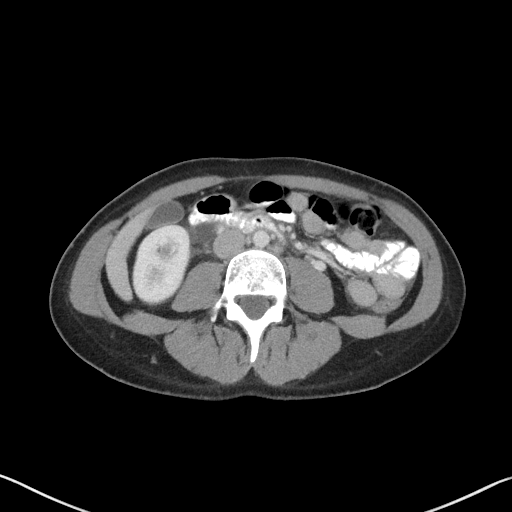
[im 66/99  soft-tissue]
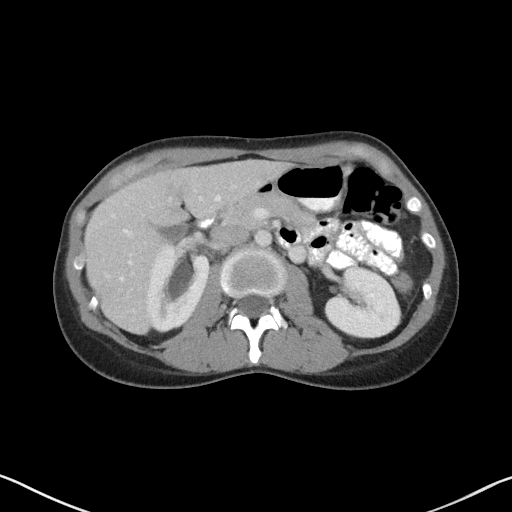
[im 66/99  bone]
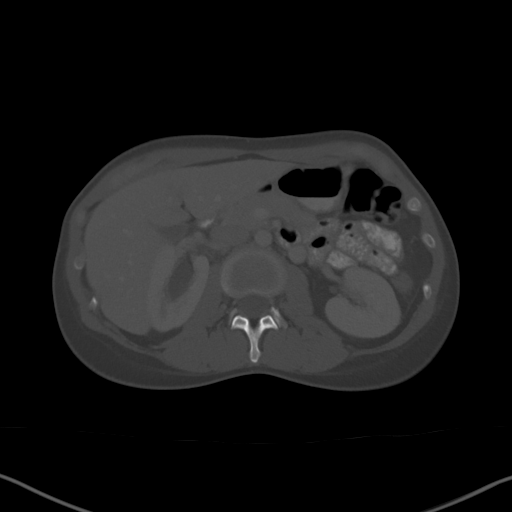
[im 71/99  soft-tissue]
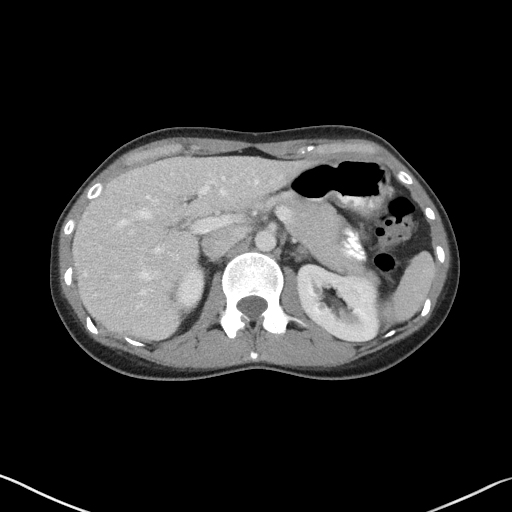
[im 77/99  soft-tissue]
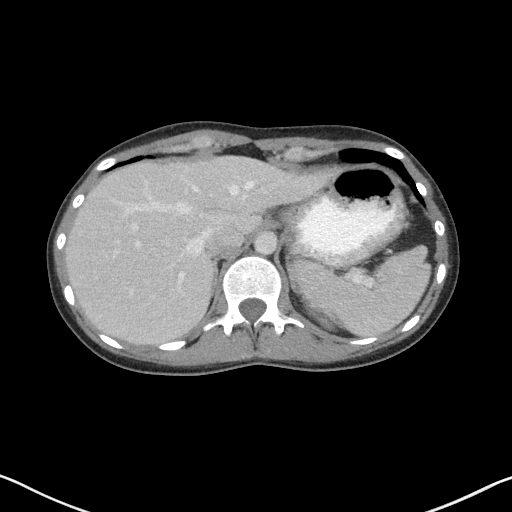
[im 88/99  soft-tissue]
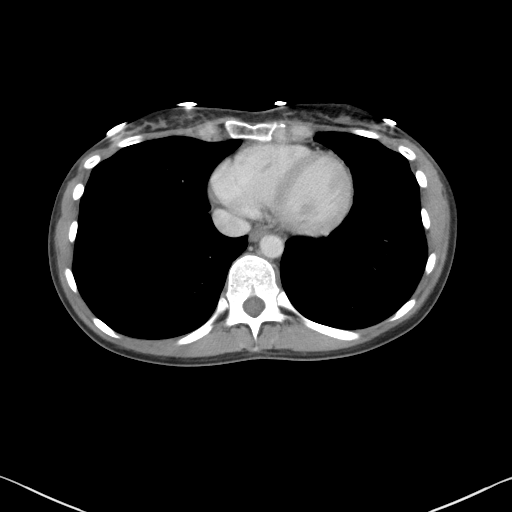
[im 93/99  soft-tissue]
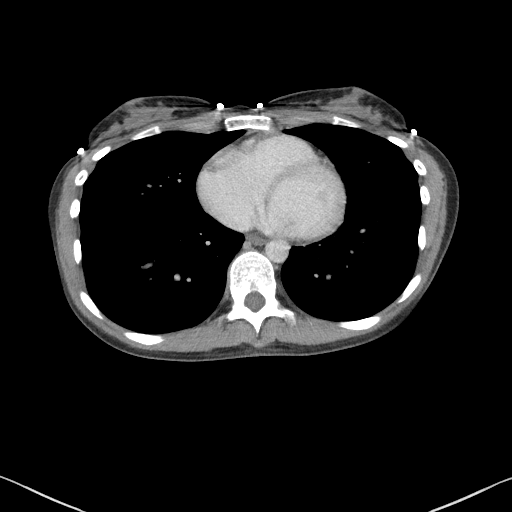

[Series 5: coronal st · coronal · 0.69mm/px · 3 of 90 slices shown]
[im 30/90  soft-tissue]
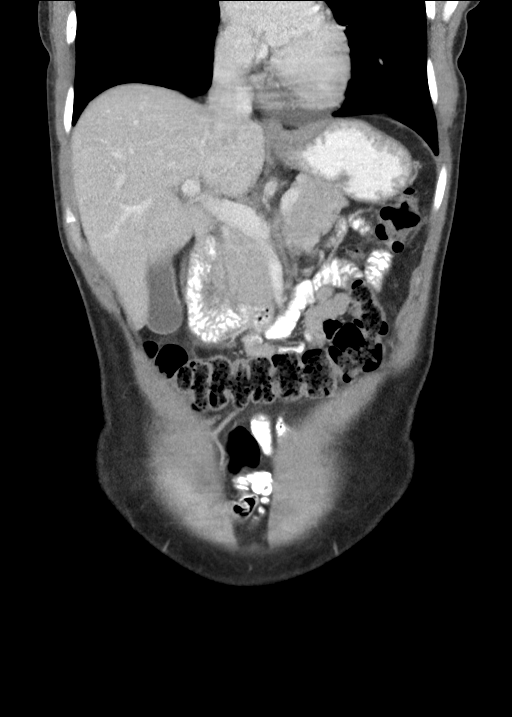
[im 40/90  soft-tissue]
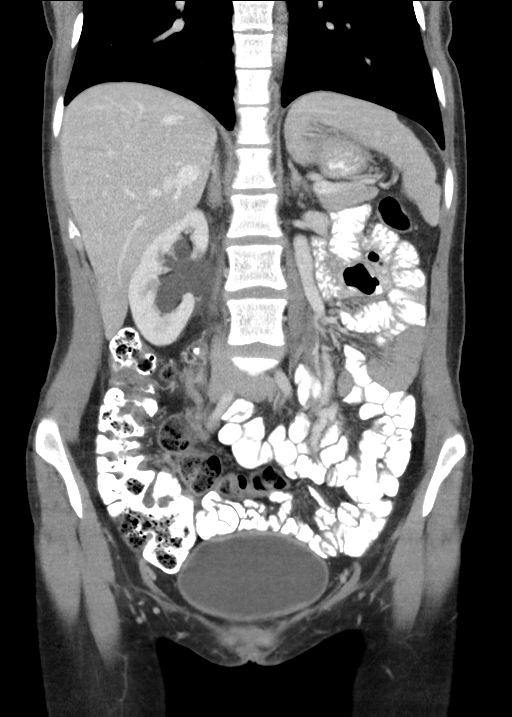
[im 50/90  soft-tissue]
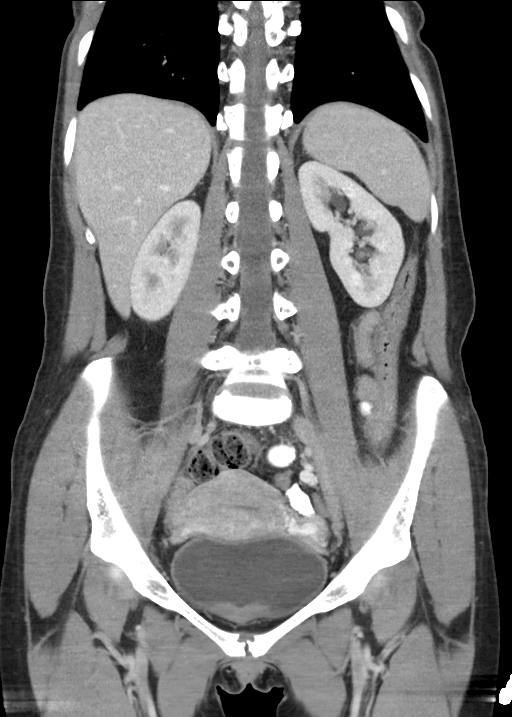

[16 of 46 positions shown; findings below may reference images not displayed]

FINDINGS: Lower chest: Unremarkable.

Hepatobiliary: Normal appearing gallbladder. Focal fat deposition in
the liver adjacent to the falciform ligament.

Pancreas: Unremarkable. No pancreatic ductal dilatation or
surrounding inflammatory changes.

Spleen: Normal in size without focal abnormality.

Adrenals/Urinary Tract: Moderate dilatation of the right renal
collecting system to the level of a 5 mm calculus in the proximal
ureter, just distal to the UPJ. Tiny lower pole right renal
calculus. Unremarkable left kidney, left ureter and urinary bladder.
Normal appearing adrenal glands.

Stomach/Bowel: Stomach is within normal limits. Appendix appears
normal. No evidence of bowel wall thickening, distention, or
inflammatory changes.

Vascular/Lymphatic: No significant vascular findings are present. No
enlarged abdominal or pelvic lymph nodes.

Reproductive: Uterus and bilateral adnexa are unremarkable.

Other: Tiny umbilical hernia containing fat.

Musculoskeletal: Normal appearing bones.
IMPRESSION: 1. 5 mm proximal right ureteral calculus causing moderate right
hydronephrosis.
2. Tiny, nonobstructing lower pole right renal calculus.
3. Normal appendix.

## 2023-01-05 ENCOUNTER — Telehealth: Payer: Self-pay

## 2023-01-05 NOTE — Telephone Encounter (Signed)
Mychart msg sent. AS, CMA 

## 2023-09-07 ENCOUNTER — Ambulatory Visit: Payer: Self-pay | Admitting: *Deleted

## 2023-09-07 NOTE — Telephone Encounter (Signed)
  Chief Complaint: blood noted in stool  Symptoms: dizziness at times. Paitent is now on menstrual cycle but is using tampons. When had BM x 2-3 days has noticed blood in stool and when wiping rectum. Frequency: 2-3 days  Pertinent Negatives: Patient denies chest pain no cool clammy skin no severe dizziness.  Disposition: [x] ED /[] Urgent Care (no appt availability in office) / [] Appointment(In office/virtual)/ []  [facility]/ [] Home Care/ [] Refused Recommended Disposition /[] [facility]/ []  Follow-up with PCP Additional Notes:   No PCP . Offered to assist finding PCP. Promoted patient to use My Chart to find a PCP. Reviewed with patient if sx continue or sx worsen go to ED.    Summary: blood in stool..2 to 3 days, alsoo on menstrual cycle   Blood in stool.. 2 to 3 days .Also, on menstrual cycle. Please advise               Reason for Disposition  [1] Rectal bleeding is minimal (e.g., blood just on toilet paper, few drops, streaks on surface of normal formed BM) AND [2] bleeding recurs 3 or more times on treatment  Answer Assessment - Initial Assessment Questions 1. APPEARANCE of BLOOD: "What color is it?" "Is it passed separately, on the surface of the stool, or mixed in with the stool?"      Blood mixed in stool bright red  2. AMOUNT: "How much blood was passed?"      Less than 50 % in stool  3. FREQUENCY: "How many times has blood been passed with the stools?"      2 times  4. ONSET: "When was the blood first seen in the stools?" (Days or weeks)      2-3 days  5. DIARRHEA: "Is there also some diarrhea?" If Yes, ask: "How many diarrhea stools in the past 24 hours?"      X 1 in am  6. CONSTIPATION: "Do you have constipation?" If Yes, ask: "How bad is it?"     na 7. RECURRENT SYMPTOMS: "Have you had blood in your stools before?" If Yes, ask: "When was the last time?" and "What happened that time?"      No  8. BLOOD THINNERS: "Do you take any blood  thinners?" (e.g., Coumadin/warfarin, Pradaxa/dabigatran, aspirin)     No  9. OTHER SYMPTOMS: "Do you have any other symptoms?"  (e.g., abdomen pain, vomiting, dizziness, fever)     Some dizziness rectal bleeding with blood noted in stool. On menstrual cycle and using tampons and reports blood noted on tissue paper after wiping rectal area 10. PREGNANCY: "Is there any chance you are pregnant?" "When was your last menstrual period?"       na  Protocols used: Rectal Bleeding-A-AH

## 2023-09-11 ENCOUNTER — Encounter (HOSPITAL_BASED_OUTPATIENT_CLINIC_OR_DEPARTMENT_OTHER): Payer: Self-pay | Admitting: Family Medicine

## 2023-09-11 ENCOUNTER — Ambulatory Visit (HOSPITAL_BASED_OUTPATIENT_CLINIC_OR_DEPARTMENT_OTHER): Payer: Medicaid Other | Admitting: Family Medicine

## 2023-09-11 DIAGNOSIS — Z0000 Encounter for general adult medical examination without abnormal findings: Secondary | ICD-10-CM | POA: Diagnosis not present

## 2023-09-11 DIAGNOSIS — K625 Hemorrhage of anus and rectum: Secondary | ICD-10-CM | POA: Diagnosis not present

## 2023-09-11 NOTE — Progress Notes (Signed)
New Patient Office Visit  Subjective    Patient ID: Patricia Vaughn, female    DOB: [birth year]  Age: 32 y.o. MRN: [id number]  CC:  Chief Complaint  Patient presents with   New Patient (Initial Visit)    New patient has not had pcp on 9/13 noticed bright red blood in stool when wiping and it was in stool     HPI Patricia Vaughn presents to establish care Last PCP - no recent PCP; utilizes ED for acute issues  Blood on stool: noted about 4 days ago, has not occurred on subsequent days. Denies any pain at all. No prior issues with this. She was concerned as there is cancer in her family - reports that sister had ovaries removed, grandparent passed away from cancer, uncertain specific type. No weight changes, no fevers, chills, sweats. No history of dark or tarry stools. No reported diarrhea or constipation.  No regular medications.  Patient is originally from [place]. Patient works in Scientist, research (physical sciences) at [employer]. She enjoys staying home, spending time with friends.  Outpatient Encounter Medications as of 09/11/2023  Medication Sig   [DISCONTINUED] Norethindrone-Ethinyl Estradiol-Fe Biphas (LO LOESTRIN FE) 1 MG-10 MCG / 10 MCG tablet Take 1 tablet by mouth daily. Take 1 daily by mouth   [DISCONTINUED] ondansetron (ZOFRAN) 4 MG tablet Take 1 tablet (4 mg total) by mouth every 8 (eight) hours as needed for nausea or vomiting.   [DISCONTINUED] oxyCODONE-acetaminophen (PERCOCET) 5-325 MG tablet Take 1 tablet by mouth every 6 (six) hours as needed for moderate pain or severe pain.   [DISCONTINUED] prenatal vitamin w/FE, FA (PRENATAL 1 + 1) 27-1 MG TABS tablet Take 1 tablet by mouth daily at 12 noon.   No facility-administered encounter medications on file as of 09/11/2023.    Past Medical History:  Diagnosis Date   Medical history non-contributory    Miscarriage, threatened, early pregnancy 03/28/2013   Nausea 11/06/2013   Supervision of low-risk pregnancy 11/06/2013    Past Surgical  History:  Procedure Laterality Date   NO PAST SURGERIES      Family History  Problem Relation Age of Onset   Hyperlipidemia Father    Cancer Maternal Grandfather    Diabetes Other    Hypertension Other    Cancer Maternal Aunt     Social History   Socioeconomic History   Marital status: Single    Spouse name: Ileana Roup   Number of children: 2   Years of education: 12   Highest education level: Some college, no degree  Occupational History   Not on file  Tobacco Use   Smoking status: Former   Smokeless tobacco: Never  Vaping Use   Vaping status: Never Used  Substance and Sexual Activity   Alcohol use: No   Drug use: No   Sexual activity: Yes    Birth control/protection: None, Condom  Other Topics Concern   Not on file  Social History Narrative   Not on file   Social Determinants of Health   Financial Resource Strain: Low Risk  (09/07/2023)   Overall Financial Resource Strain (CARDIA)    Difficulty of Paying Living Expenses: Not hard at all  Food Insecurity: No Food Insecurity (09/07/2023)   Hunger Vital Sign    Worried About Running Out of Food in the Last Year: Never true    Ran Out of Food in the Last Year: Never true  Transportation Needs: No Transportation Needs (09/07/2023)   PRAPARE - Transportation  Lack of Transportation (Medical): No    Lack of Transportation (Non-Medical): No  Physical Activity: Sufficiently Active (09/07/2023)   Exercise Vital Sign    Days of Exercise per Week: 5 days    Minutes of Exercise per Session: 60 min  Stress: No Stress Concern Present (09/07/2023)   [employer] of Occupational Health - Occupational Stress Questionnaire    Feeling of Stress : Only a little  Social Connections: Socially Isolated (09/07/2023)   Social Connection and Isolation Panel [NHANES]    Frequency of Communication with Friends and Family: More than three times a week    Frequency of Social Gatherings with Friends and Family: Twice a week     Attends Religious Services: Never    Database administrator or Organizations: No    Attends Engineer, structural: Not on file    Marital Status: Divorced  Intimate Partner Violence: Not At Risk (10/14/2018)   Humiliation, Afraid, Rape, and Kick questionnaire    Fear of Current or Ex-Partner: No    Emotionally Abused: No    Physically Abused: No    Sexually Abused: No    Objective    BP 118/72 (BP Location: Left Arm, Patient Position: Sitting, Cuff Size: Normal)   Pulse 66   Ht 5\' 7"  (1.702 m)   Wt 118 lb (53.5 kg)   SpO2 98%   BMI 18.48 kg/m   Physical Exam  32 year old female in no acute distress Cardiovascular exam regular rate and rhythm Lungs clear to auscultation bilaterally  Assessment & Plan:   Wellness examination -     Comprehensive metabolic panel -     CBC with Differential/Platelet -     Lipid panel -     TSH Rfx on Abnormal to Free T4  Rectal bleeding Assessment & Plan: We reviewed common causes for rectal bleeding including fissures, hemorrhoids.  Additionally, patient did have menstrual period during time of observed rectal bleeding which may have also led to observed blood.  Discussed that underlying cancer is much less likely compared to above causes, particularly given patient age as well as lack of other symptoms.  We discussed considerations regarding evaluation and management.  She defers physical examination in office today and would prefer to continue with monitoring given that it was a one-time episode and has not had any further recurrence.  This is reasonable.  Cautioned on potential warning signs, advised on returning to the office should symptoms return.   Return in about 4 weeks (around 10/09/2023) for CPE.    ___________________________________________ Mackinley Kiehn de [place], MD, ABFM, [facility] and Sports Medicine [facility]

## 2023-09-11 NOTE — Assessment & Plan Note (Signed)
We reviewed common causes for rectal bleeding including fissures, hemorrhoids.  Additionally, patient did have menstrual period during time of observed rectal bleeding which may have also led to observed blood.  Discussed that underlying cancer is much less likely compared to above causes, particularly given patient age as well as lack of other symptoms.  We discussed considerations regarding evaluation and management.  She defers physical examination in office today and would prefer to continue with monitoring given that it was a one-time episode and has not had any further recurrence.  This is reasonable.  Cautioned on potential warning signs, advised on returning to the office should symptoms return.

## 2023-10-10 ENCOUNTER — Encounter (HOSPITAL_BASED_OUTPATIENT_CLINIC_OR_DEPARTMENT_OTHER): Payer: Self-pay | Admitting: Family Medicine

## 2023-10-10 ENCOUNTER — Ambulatory Visit (INDEPENDENT_AMBULATORY_CARE_PROVIDER_SITE_OTHER): Payer: Medicaid Other | Admitting: Family Medicine

## 2023-10-10 VITALS — BP 107/72 | HR 81 | Ht 67.0 in | Wt 118.1 lb

## 2023-10-10 DIAGNOSIS — Z0000 Encounter for general adult medical examination without abnormal findings: Secondary | ICD-10-CM | POA: Diagnosis not present

## 2023-10-10 NOTE — Assessment & Plan Note (Signed)
Routine HCM labs reviewed. HCM reviewed/discussed. Anticipatory guidance regarding healthy weight, lifestyle and choices given. Recommend healthy diet.  Recommend approximately 150 minutes/week of moderate intensity exercise Recommend regular dental and vision exams Always use seatbelt/lap and shoulder restraints Recommend using smoke alarms and checking batteries at least twice a year Recommend using sunscreen when outside Discussed tetanus immunization recommendations, patient UTD Recommend seasonal influenza vaccine, patient declines

## 2023-10-10 NOTE — Progress Notes (Signed)
Subjective:    CC: Annual Physical Exam  HPI:  Patricia Vaughn is a 32 y.o. presenting for annual physical  I reviewed the past medical history, family history, social history, surgical history, and allergies today and no changes were needed.  Please see the problem list section below in epic for further details.  Past Medical History: Past Medical History:  Diagnosis Date   Medical history non-contributory    Miscarriage, threatened, early pregnancy 03/28/2013   Nausea 11/06/2013   Supervision of low-risk pregnancy 11/06/2013   Past Surgical History: Past Surgical History:  Procedure Laterality Date   NO PAST SURGERIES     Social History: Social History   Socioeconomic History   Marital status: Single    Spouse name: Ileana Roup   Number of children: 2   Years of education: 12   Highest education level: Some college, no degree  Occupational History   Not on file  Tobacco Use   Smoking status: Former   Smokeless tobacco: Never  Vaping Use   Vaping status: Never Used  Substance and Sexual Activity   Alcohol use: No   Drug use: No   Sexual activity: Yes    Birth control/protection: None, Condom  Other Topics Concern   Not on file  Social History Narrative   Not on file   Social Determinants of Health   Financial Resource Strain: Low Risk  (09/07/2023)   Overall Financial Resource Strain (CARDIA)    Difficulty of Paying Living Expenses: Not hard at all  Food Insecurity: No Food Insecurity (09/07/2023)   Hunger Vital Sign    Worried About Running Out of Food in the Last Year: Never true    Ran Out of Food in the Last Year: Never true  Transportation Needs: No Transportation Needs (09/07/2023)   PRAPARE - Administrator, Civil Service (Medical): No    Lack of Transportation (Non-Medical): No  Physical Activity: Sufficiently Active (09/07/2023)   Exercise Vital Sign    Days of Exercise per Week: 5 days    Minutes of Exercise per Session: 60 min  Stress:  No Stress Concern Present (09/07/2023)   [employer] of Occupational Health - Occupational Stress Questionnaire    Feeling of Stress : Only a little  Social Connections: Socially Isolated (09/07/2023)   Social Connection and Isolation Panel [NHANES]    Frequency of Communication with Friends and Family: More than three times a week    Frequency of Social Gatherings with Friends and Family: Twice a week    Attends Religious Services: Never    Database administrator or Organizations: No    Attends Engineer, structural: Not on file    Marital Status: Divorced   Family History: Family History  Problem Relation Age of Onset   Hyperlipidemia Father    Cancer Maternal Grandfather    Diabetes Other    Hypertension Other    Cancer Maternal Aunt    Allergies: No Known Allergies Medications: See med rec.  Review of Systems: No headache, visual changes, nausea, vomiting, diarrhea, constipation, dizziness, abdominal pain, skin rash, fevers, chills, night sweats, swollen lymph nodes, weight loss, chest pain, body aches, joint swelling, muscle aches, shortness of breath, mood changes, visual or auditory hallucinations.  Objective:    BP 107/72 (BP Location: Right Arm, Patient Position: Sitting, Cuff Size: Normal)   Pulse 81   Ht 5\' 7"  (1.702 m)   Wt 118 lb 1.6 oz (53.6 kg)   SpO2 100%  BMI 18.50 kg/m   General: Well Developed, well nourished, and in no acute distress. Neuro: Alert and oriented x3, extra-ocular muscles intact, sensation grossly intact. Cranial nerves II through XII are intact, motor, sensory, and coordinative functions are all intact. HEENT: Normocephalic, atraumatic, pupils equal round reactive to light, neck supple, no masses, no lymphadenopathy, thyroid nonpalpable. Oropharynx, nasopharynx, external ear canals are unremarkable. Skin: Warm and dry, no rashes noted.  Cardiac: Regular rate and rhythm, no murmurs rubs or gallops. Respiratory: Clear to  auscultation bilaterally. Not using accessory muscles, speaking in full sentences. Abdominal: Soft, nontender, nondistended, positive bowel sounds, no masses, no organomegaly. Musculoskeletal: Shoulder, elbow, wrist, hip, knee, ankle stable, and with full range of motion.  Impression and Recommendations:    Wellness examination Assessment & Plan: Routine HCM labs reviewed. HCM reviewed/discussed. Anticipatory guidance regarding healthy weight, lifestyle and choices given. Recommend healthy diet.  Recommend approximately 150 minutes/week of moderate intensity exercise Recommend regular dental and vision exams Always use seatbelt/lap and shoulder restraints Recommend using smoke alarms and checking batteries at least twice a year Recommend using sunscreen when outside Discussed tetanus immunization recommendations, patient UTD Recommend seasonal influenza vaccine, patient declines   Patient with intermittent wrist pain. 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 Primarily wrist flexors affected. Proceed with conservative measures. Return if not improving.  Return in about 1 year (around 10/09/2024) for CPE.   ___________________________________________ Balin Vandegrift de [place], MD, ABFM, [facility] Sports Medicine [facility]

## 2024-01-22 ENCOUNTER — Encounter (HOSPITAL_BASED_OUTPATIENT_CLINIC_OR_DEPARTMENT_OTHER): Payer: Self-pay

## 2024-08-21 ENCOUNTER — Encounter (HOSPITAL_BASED_OUTPATIENT_CLINIC_OR_DEPARTMENT_OTHER): Payer: Self-pay

## 2024-08-26 ENCOUNTER — Encounter (HOSPITAL_BASED_OUTPATIENT_CLINIC_OR_DEPARTMENT_OTHER): Payer: Self-pay

## 2024-10-10 ENCOUNTER — Encounter (HOSPITAL_BASED_OUTPATIENT_CLINIC_OR_DEPARTMENT_OTHER): Payer: Medicaid Other | Admitting: Family Medicine

## 2024-10-14 ENCOUNTER — Ambulatory Visit (INDEPENDENT_AMBULATORY_CARE_PROVIDER_SITE_OTHER): Admitting: Family Medicine

## 2024-10-14 VITALS — BP 105/69 | HR 84 | Temp 99.3°F | Resp 16 | Wt 116.8 lb

## 2024-10-14 DIAGNOSIS — Z0000 Encounter for general adult medical examination without abnormal findings: Secondary | ICD-10-CM

## 2024-10-14 DIAGNOSIS — Z124 Encounter for screening for malignant neoplasm of cervix: Secondary | ICD-10-CM | POA: Diagnosis not present

## 2024-10-14 DIAGNOSIS — M79675 Pain in left toe(s): Secondary | ICD-10-CM

## 2024-10-14 NOTE — Assessment & Plan Note (Signed)

## 2024-10-14 NOTE — Progress Notes (Signed)
 Subjective:    CC: Annual Physical Exam  HPI: Patricia Vaughn is a 33 y.o. presenting for annual physical  I reviewed the past medical history, family history, social history, surgical history, and allergies today and no changes were needed.  Please see the problem list section below in epic for further details.  Past Medical History: Past Medical History:  Diagnosis Date   Medical history non-contributory    Miscarriage, threatened, early pregnancy 03/28/2013   Nausea 11/06/2013   Supervision of low-risk pregnancy 11/06/2013   Past Surgical History: Past Surgical History:  Procedure Laterality Date   NO PAST SURGERIES     Social History: Social History   Socioeconomic History   Marital status: Single    Spouse name: Curlee Rank   Number of children: 2   Years of education: 12   Highest education level: Some college, no degree  Occupational History   Not on file  Tobacco Use   Smoking status: Former   Smokeless tobacco: Never  Vaping Use   Vaping status: Never Used  Substance and Sexual Activity   Alcohol use: No   Drug use: No   Sexual activity: Yes    Birth control/protection: None, Condom  Other Topics Concern   Not on file  Social History Narrative   Not on file   Social Drivers of Health   Financial Resource Strain: Low Risk  (09/07/2023)   Overall Financial Resource Strain (CARDIA)    Difficulty of Paying Living Expenses: Not hard at all  Food Insecurity: No Food Insecurity (09/07/2023)   Hunger Vital Sign    Worried About Running Out of Food in the Last Year: Never true    Ran Out of Food in the Last Year: Never true  Transportation Needs: No Transportation Needs (09/07/2023)   PRAPARE - Administrator, Civil Service (Medical): No    Lack of Transportation (Non-Medical): No  Physical Activity: Sufficiently Active (09/07/2023)   Exercise Vital Sign    Days of Exercise per Week: 5 days    Minutes of Exercise per Session: 60 min  Stress: No  Stress Concern Present (09/07/2023)   [employer] of Occupational Health - Occupational Stress Questionnaire    Feeling of Stress : Only a little  Social Connections: Socially Isolated (09/07/2023)   Social Connection and Isolation Panel    Frequency of Communication with Friends and Family: More than three times a week    Frequency of Social Gatherings with Friends and Family: Twice a week    Attends Religious Services: Never    Database administrator or Organizations: No    Attends Engineer, structural: Not on file    Marital Status: Divorced   Family History: Family History  Problem Relation Age of Onset   Hyperlipidemia Father    Cancer Maternal Grandfather    Diabetes Other    Hypertension Other    Cancer Maternal Aunt    Allergies: No Known Allergies Medications: See med rec.  Review of Systems: No headache, visual changes, nausea, vomiting, diarrhea, constipation, dizziness, abdominal pain, skin rash, fevers, chills, night sweats, swollen lymph nodes, weight loss, chest pain, body aches, joint swelling, muscle aches, shortness of breath, mood changes, visual or auditory hallucinations.  Objective:    BP 105/69 (BP Location: Right Arm, Patient Position: Sitting, Cuff Size: Normal)   Pulse 84   Temp 99.3 F (37.4 C) (Oral)   Resp 16   Wt 116 lb 12.8 oz (53 kg)  LMP 09/22/2024 (Approximate)   SpO2 100%   BMI 18.29 kg/m   General: Well Developed, well nourished, and in no acute distress. Neuro: Alert and oriented x3, extra-ocular muscles intact, sensation grossly intact. Cranial nerves II through XII are intact, motor, sensory, and coordinative functions are all intact. HEENT: Normocephalic, atraumatic, pupils equal round reactive to light, neck supple, no masses, no lymphadenopathy, thyroid  nonpalpable. Oropharynx, nasopharynx, external ear canals are unremarkable. Skin: Warm and dry, no rashes noted. Cardiac: Regular rate and rhythm, no murmurs rubs or  gallops. Respiratory: Clear to auscultation bilaterally. Not using accessory muscles, speaking in full sentences. Abdominal: Soft, nontender, nondistended, positive bowel sounds, no masses, no organomegaly. Musculoskeletal: Shoulder, elbow, wrist, hip, knee, ankle stable, and with full range of motion.  Impression and Recommendations:    Wellness examination Assessment & Plan: Routine HCM labs ordered. HCM reviewed/discussed. Anticipatory guidance regarding healthy weight, lifestyle and choices given. Recommend healthy diet.  Recommend approximately 150 minutes/week of moderate intensity exercise Recommend regular dental and vision exams Always use seatbelt/lap and shoulder restraints Recommend using smoke alarms and checking batteries at least twice a year Recommend using sunscreen when outside Discussed immunization recommendations  Orders: -     CBC with Differential/Platelet; Future -     Comprehensive metabolic panel with GFR; Future -     Lipid panel; Future -     TSH Rfx on Abnormal to Free T4; Future  Cervical cancer screening -     Ambulatory referral to Obstetrics / Gynecology  Pain of toe of left foot -     Ambulatory referral to Podiatry  Patient with concerns of left great toe pain due to recurrent injuries, primarily while playing soccer.  Requesting referral to specialist, referral placed today.  Does not have current OB/GYN, would like referral to establish with new office locally, referral placed today.  Return in about 1 year (around 10/14/2025) for CPE.   ___________________________________________ Tanor Glaspy de [place], MD, ABFM, [facility] Primary Care and Sports Medicine [facility]

## 2024-10-16 ENCOUNTER — Encounter: Payer: Self-pay | Admitting: Podiatry

## 2024-10-16 ENCOUNTER — Ambulatory Visit (INDEPENDENT_AMBULATORY_CARE_PROVIDER_SITE_OTHER)

## 2024-10-16 ENCOUNTER — Ambulatory Visit: Admitting: Podiatry

## 2024-10-16 DIAGNOSIS — M7752 Other enthesopathy of left foot: Secondary | ICD-10-CM

## 2024-10-16 DIAGNOSIS — S93522A Sprain of metatarsophalangeal joint of left great toe, initial encounter: Secondary | ICD-10-CM | POA: Diagnosis not present

## 2024-10-16 DIAGNOSIS — S93529A Sprain of metatarsophalangeal joint of unspecified toe(s), initial encounter: Secondary | ICD-10-CM

## 2024-10-16 MED ORDER — MELOXICAM 15 MG PO TABS: 15.0000 mg | ORAL_TABLET | Freq: Every day | ORAL | 0 refills | Status: AC

## 2024-10-16 NOTE — Progress Notes (Signed)
  Subjective:  Patient ID: Patricia Vaughn, female    DOB: 1991-05-24,  MRN: [id number]  Chief Complaint  Patient presents with   Toe Pain    L great toe pain. Injury in June with bruising.  Last week slid at work and bent back again. Not Diabetic. No anti coag    Discussed the use of AI scribe software for clinical note transcription with the patient, who gave verbal consent to proceed.  History of Present Illness Patricia Vaughn is a 33 year old female who presents with left first toe pain following repeated injuries.  She experiences pain in the joint of the left first toe, affecting both the dorsal and plantar aspects. The pain worsens with upward pressure on the toe, while downward flexion does not cause discomfort. She uses tape to stabilize the toe, which alleviates pain during ambulation. 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  The initial injury occurred in June during a soccer game, and she has since aggravated the injury multiple times, most recently on the past Sunday. The pain has led her to cease gym activities, particularly exercises like lunges that require toe flexion.      Objective:    Physical Exam DP and PT pulses palpable 2/4 bilaterally.  Cap refill intact to digits Protective and light touch sensation intact. No open wounds or lesions noted.  Pedal skin well-hydrated with normal skin texture and skin turgor. Mo strength 5/5 all major muscle groups. Left first MPJ pain with passive dorsiflexion.  There is some pain on palpation of the first BJs specially plantarly around the sesamoid complex.  Mild bunion deformity present left foot    No images are attached to the encounter.    Results RADIOLOGY Left foot X-ray: No evidence of fracture.  Normal osseous mineralization.  Joint spaces preserved.  Mild to moderate bony deformity present with sesamoid rotation.   Assessment:   1. Turf toe, initial encounter       Plan:  Patient was evaluated and treated and all questions answered.  Assessment and Plan Assessment & Plan Left first toe joint pain with possible turf toe and hallux valgus Chronic left first toe joint pain likely due to repetitive trauma, possible turf toe. X-rays negative for fracture. Hallux valgus may contribute to symptoms. - Fit for protective boot to immobilize toe joint. - Prescribed meloxicam 15 mg once a day in the morning, as needed 30 days, advised against concurrent NSAIDs. - Advised icing for pain relief. - Recommended Dancer's pads to offload pressure. - Instructed to purchase 'even up' device for opposite shoe. - Plan to reassess in 3-4 weeks, consider range of motion exercises. - Consider MRI if no improvement in symptoms.      Return in about 4 weeks (around 11/13/2024) for 1st mpj pain/turf toe.

## 2024-10-16 NOTE — Patient Instructions (Signed)
Look for an "EvenUp" shoe attachment on [employer] or at [employer]. This will level out your hips while you are walking in the CAM boot. Wear this on the other foot around a supportive sneaker:     

## 2024-11-13 ENCOUNTER — Ambulatory Visit: Admitting: Podiatry

## 2024-11-13 ENCOUNTER — Encounter: Payer: Self-pay | Admitting: Podiatry

## 2024-11-13 DIAGNOSIS — S93529D Sprain of metatarsophalangeal joint of unspecified toe(s), subsequent encounter: Secondary | ICD-10-CM | POA: Diagnosis not present

## 2024-11-13 DIAGNOSIS — M205X2 Other deformities of toe(s) (acquired), left foot: Secondary | ICD-10-CM

## 2024-11-13 MED ORDER — METHYLPREDNISOLONE 4 MG PO TBPK: ORAL_TABLET | ORAL | 0 refills | Status: AC

## 2024-11-13 NOTE — Patient Instructions (Signed)
 Complete medrol dosepak, following this take naproxen twice a day for 2 weeks.

## 2024-11-13 NOTE — Progress Notes (Signed)
  Subjective:  Patient ID: Patricia Vaughn, female    DOB: 16-Jan-1991,  MRN: [id number]  Chief Complaint  Patient presents with   Toe Pain    L great toe is feeling better 50%. Has been wearing boot and not weight bearing on toe. Toe has been stiff the last week. Not diabetic. No anti coag    Discussed the use of AI scribe software for clinical note transcription with the patient, who gave verbal consent to proceed.  History of Present Illness Patricia Vaughn is a 33 year old female who presents with persistent soreness and stiffness in the left big toe joint following a soccer-related injury.  The soreness and stiffness in her left big toe joint have persisted for several months onset in July, primarily affecting the joint itself. Pain intensifies during dorsiflexion and has been more pronounced in the past two weeks.  She has been wearing a boot consistently for seven weeks, which alleviates the pain. She removes it only for showering, bathing, and sleeping. She initially wore it during nighttime bathroom trips but stopped due to inconvenience. Meloxicam  is not taken regularly as the pain is manageable with immobilization. Dancer's pads are used in the boot, though they were not brought to the appointment.  Pain is localized to the joint without significant swelling.      Objective:    Physical Exam CARDIOVASCULAR: DP and PT pulses intact. MUSCULOSKELETAL: Left big toe joint with turf toe, pain on dorsiflexion, no swelling.  There is some tenderness on palpation of the sesamoid complex as well NEUROLOGICAL: Muscle strength 5/5 for all major muscle groups. Light touch and protective sensation intact.   No images are attached to the encounter.    Results    Assessment:   1. Turf toe, subsequent encounter   2. Hallux limitus, left      Plan:  Patient was evaluated and treated and all questions answered.  Assessment and Plan Assessment & Plan Left turf toe (sprain of left  first metatarsophalangeal joint) Persistent soreness with mobilization, particularly dorsiflexion. Pain localized to joint with tenderness around sesamoid bones. Differential includes ligament disruption or joint instability. MRI indicated for underlying pathology assessment. Steroid injection considered if MRI shows joint involvement, deferred due to potential impact on soft tissue healing. - Ordered MRI of left first metatarsophalangeal joint to assess for ligament disruption or joint instability. - 6-day Medrol  Dosepak sent to patient's pharmacy - Advised discontinuation of meloxicam  during steroid course, may resume upon completion. - Recommended continuation of anti-inflammatory medication (Aleve or naproxen if out of meloxicam ) for two weeks post-steroid course. - Encouraged use of dancer's pads in boot for joint support. - Advised mobilization of big toe joint with fingers to prevent stiffness. - Instructed to remain in boot when on feet for protection.  Left hallux limitus - Decreased first MPJ range of motion noted on exam, possibly aggravating the area of injury.      Return in about 4 weeks (around 12/11/2024) for turf toe, mri results.

## 2024-12-02 ENCOUNTER — Inpatient Hospital Stay: Admission: RE | Admit: 2024-12-02 | Discharge: 2024-12-02 | Attending: Podiatry

## 2024-12-02 DIAGNOSIS — S93522A Sprain of metatarsophalangeal joint of left great toe, initial encounter: Secondary | ICD-10-CM | POA: Diagnosis not present

## 2024-12-02 DIAGNOSIS — S93529D Sprain of metatarsophalangeal joint of unspecified toe(s), subsequent encounter: Secondary | ICD-10-CM

## 2024-12-02 MED ORDER — GADOPICLENOL 0.5 MMOL/ML IV SOLN
7.5000 mL | Freq: Once | INTRAVENOUS | Status: AC | PRN
  Administered 2024-12-02: 6 mL via INTRAVENOUS

## 2024-12-11 ENCOUNTER — Encounter: Payer: Self-pay | Admitting: Podiatry

## 2024-12-11 ENCOUNTER — Ambulatory Visit: Admitting: Podiatry

## 2024-12-11 DIAGNOSIS — M25872 Other specified joint disorders, left ankle and foot: Secondary | ICD-10-CM

## 2024-12-11 DIAGNOSIS — M205X2 Other deformities of toe(s) (acquired), left foot: Secondary | ICD-10-CM

## 2024-12-11 NOTE — Progress Notes (Signed)
 Subjective:  Patient ID: Patricia Vaughn, female    DOB: Apr 13, 1991,  MRN: [id number]  Chief Complaint  Patient presents with   Foot Pain    L foot is feeling bette overall  Does not wear boot When having to work and foot does get soe at end of shift.    Discussed the use of AI scribe software for clinical note transcription with the patient, who gave verbal consent to proceed.  History of Present Illness Patricia Vaughn is a 33 year old female with left hallux limitus and sesamoiditis who presents for follow-up of persistent left first metatarsophalangeal joint pain after returning to work.  She has ongoing mild left first metatarsophalangeal joint pain, mainly at night, rated 2-3/10 and less severe than at onset. 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 She cannot wear a boot at work due to wet conditions.  She uses KT tape, a supportive band, dancer's pads, and modified footwear, which have reduced her pain. A prior 6-week course of boot use caused back and hip pain, so she now wears shoes with some height to maintain a level gait. She continues to limp and avoids bending the toe while walking at work.  She has no significant plantar foot pain. She has mild pain with dorsiflexion of the left hallux but not with plantarflexion. She has not needed a steroid injection, and there have been no new injuries since the original non-occupational event.        Objective:    Physical Exam CARDIOVASCULAR: DP and PT pulses palpable, capillary refill intact. EXTREMITIES: No cyanosis or edema. MUSCULOSKELETAL: Decreased left first MPJ dorsiflexion approximately 45 degrees. No significant pain on palpation of sesamoid complex. NEUROLOGICAL: Light touch and protective sensation intact. SKIN: Skin within normal limits, normal texture, no skin tear.   No images are attached to the encounter.    Results Radiology Left foot MRI:  Sesamoiditis; no evidence of turf toe injury or plantar plate injury; degenerative changes of the first metatarsophalangeal joint (Independently interpreted)    Assessment:   1. Hallux limitus, left   2. Sesamoiditis of left foot      Plan:  Patient was evaluated and treated and all questions answered.  Assessment and Plan Assessment & Plan Hallux limitus, left Chronic mild hallux limitus with arthritic changes and limited dorsiflexion. Symptoms mild, primarily nocturnal and post-activity. Conservative management effective. - Recommended supportive, stiff-soled footwear with slight toe rocker to reduce joint stress. - Advised use of quality over-the-counter orthotic inserts for arch support and to limit joint motion. - Instructed on gradual break-in of new inserts, starting with one hour daily and increasing as tolerated. - Dispensed two dancer's pads to offload the first metatarsophalangeal joint. - Provided specific shoe model recommendations in the after visit summary. - Advised against excessive gait modification; encouraged maintenance of normal gait pattern. - Discussed corticosteroid injection not indicated due to mild symptoms, but may be considered if pain flares. Reviewed risks of repeated injections.  Sesamoiditis, left foot MRI showed sesamoid inflammation consistent with sesamoiditis. Symptoms improved with taping and conservative measures. No significant pain on palpation. Condition improving, no escalation of care needed. - Recommended continued use of KT tape or similar taping techniques to stabilize the first metatarsophalangeal joint. - Provided educational resources for home taping techniques specific to sesamoiditis. - Advised continued use of dancer's pads to offload the sesamoid region. - Reinforced importance of supportive footwear and orthotic inserts as above.  Return if symptoms worsen or fail to improve, for Sesamoiditis/Hallux limitus.

## 2024-12-11 NOTE — Patient Instructions (Signed)
 Shoe recommendations for hallux limitus: Specific shoe models that limit excessive first toe strain include Brooks glycerin  max, [employer]oka Clifton 9 and ASICS gel nimbus 25  Recommend good pair of over-the-counter inserts as well such as power steps or Superfeet.  They both have good websites websites where you can find an orthotic that is suited for the location of your pain and areas where you need extra support.  Lastly, look up sesamoiditis taping with [place]  foot doctors on YouTube.

## 2025-10-16 ENCOUNTER — Encounter (HOSPITAL_BASED_OUTPATIENT_CLINIC_OR_DEPARTMENT_OTHER): Admitting: Family Medicine
